# Patient Record
Sex: Female | Born: 1935 | Race: White | Hispanic: No | Marital: Single | Smoking: Never smoker
Health system: Southern US, Community
[De-identification: ages and names within clinical notes are randomized; demographics above are authoritative.]

## PROBLEM LIST (undated history)

## (undated) DIAGNOSIS — I10 Essential (primary) hypertension: Secondary | ICD-10-CM

## (undated) DIAGNOSIS — K219 Gastro-esophageal reflux disease without esophagitis: Secondary | ICD-10-CM

## (undated) DIAGNOSIS — D649 Anemia, unspecified: Secondary | ICD-10-CM

## (undated) HISTORY — DX: Gastro-esophageal reflux disease without esophagitis: K21.9

## (undated) HISTORY — PX: APPENDECTOMY: SHX54

## (undated) HISTORY — DX: Essential (primary) hypertension: I10

---

## 1998-03-13 ENCOUNTER — Emergency Department (HOSPITAL_COMMUNITY): Admission: EM | Admit: 1998-03-13 | Discharge: 1998-03-13 | Payer: Self-pay | Admitting: Emergency Medicine

## 2002-03-31 ENCOUNTER — Inpatient Hospital Stay (HOSPITAL_COMMUNITY): Admission: EM | Admit: 2002-03-31 | Discharge: 2002-04-01 | Payer: Self-pay | Admitting: Emergency Medicine

## 2002-03-31 ENCOUNTER — Encounter: Payer: Self-pay | Admitting: Emergency Medicine

## 2002-04-07 ENCOUNTER — Encounter: Admission: RE | Admit: 2002-04-07 | Discharge: 2002-04-07 | Payer: Self-pay | Admitting: Family Medicine

## 2011-12-27 ENCOUNTER — Inpatient Hospital Stay (HOSPITAL_COMMUNITY)
Admission: EM | Admit: 2011-12-27 | Discharge: 2011-12-30 | DRG: 812 | Disposition: A | Discharge status: Home / Self Care | Payer: Medicare Other | Attending: Internal Medicine | Admitting: Internal Medicine

## 2011-12-27 ENCOUNTER — Encounter (HOSPITAL_COMMUNITY): Payer: Self-pay | Admitting: *Deleted

## 2011-12-27 DIAGNOSIS — D509 Iron deficiency anemia, unspecified: Secondary | ICD-10-CM | POA: Diagnosis present

## 2011-12-27 DIAGNOSIS — D649 Anemia, unspecified: Secondary | ICD-10-CM

## 2011-12-27 DIAGNOSIS — K449 Diaphragmatic hernia without obstruction or gangrene: Secondary | ICD-10-CM | POA: Diagnosis present

## 2011-12-27 DIAGNOSIS — R0989 Other specified symptoms and signs involving the circulatory and respiratory systems: Secondary | ICD-10-CM | POA: Diagnosis present

## 2011-12-27 DIAGNOSIS — R0600 Dyspnea, unspecified: Secondary | ICD-10-CM | POA: Diagnosis present

## 2011-12-27 DIAGNOSIS — R0609 Other forms of dyspnea: Secondary | ICD-10-CM | POA: Diagnosis present

## 2011-12-27 DIAGNOSIS — D62 Acute posthemorrhagic anemia: Principal | ICD-10-CM | POA: Diagnosis present

## 2011-12-27 DIAGNOSIS — I10 Essential (primary) hypertension: Secondary | ICD-10-CM

## 2011-12-27 DIAGNOSIS — R0602 Shortness of breath: Secondary | ICD-10-CM

## 2011-12-27 DIAGNOSIS — K259 Gastric ulcer, unspecified as acute or chronic, without hemorrhage or perforation: Secondary | ICD-10-CM | POA: Diagnosis present

## 2011-12-27 HISTORY — DX: Anemia, unspecified: D64.9

## 2011-12-27 MED ORDER — SODIUM CHLORIDE 0.9 % IV SOLN
INTRAVENOUS | Status: DC
  Administered 2011-12-28 (×2): via INTRAVENOUS
  Administered 2011-12-29: 500 mL via INTRAVENOUS
  Administered 2011-12-29: 06:00:00 via INTRAVENOUS

## 2011-12-27 NOTE — ED Notes (Signed)
Pt was sent here by [facility] center after results from Hgb was 3.5.  Pt has been feeling sob with exertion.  Pt is very pale.  Pt denies any CP.  Pt denies any rectal bleeding or dark, black stools.  Pt denies any vaginal bleeding.  Pt went to be seen yesterday at [facility] center due to sob with exertion which became worse last week but had been present for about one month.

## 2011-12-28 DIAGNOSIS — R0600 Dyspnea, unspecified: Secondary | ICD-10-CM | POA: Diagnosis present

## 2011-12-28 DIAGNOSIS — D62 Acute posthemorrhagic anemia: Secondary | ICD-10-CM | POA: Diagnosis present

## 2011-12-28 DIAGNOSIS — D509 Iron deficiency anemia, unspecified: Secondary | ICD-10-CM | POA: Diagnosis present

## 2011-12-28 DIAGNOSIS — I059 Rheumatic mitral valve disease, unspecified: Secondary | ICD-10-CM

## 2011-12-28 HISTORY — DX: Dyspnea, unspecified: R06.00

## 2011-12-28 HISTORY — DX: Iron deficiency anemia, unspecified: D50.9

## 2011-12-28 HISTORY — DX: Acute posthemorrhagic anemia: D62

## 2011-12-28 LAB — COMPREHENSIVE METABOLIC PANEL
ALT: 9 U/L (ref 0–35)
AST: 12 U/L (ref 0–37)
Albumin: 3.2 g/dL — ABNORMAL LOW (ref 3.5–5.2)
Alkaline Phosphatase: 74 U/L (ref 39–117)
BUN: 16 mg/dL (ref 6–23)
CO2: 23 mEq/L (ref 19–32)
Calcium: 8.5 mg/dL (ref 8.4–10.5)
Chloride: 106 mEq/L (ref 96–112)
GFR calc Af Amer: >90 mL/min (ref >90)
GFR calc non Af Amer: 80 mL/min — ABNORMAL LOW (ref >90)
Glucose, Bld: 105 mg/dL — ABNORMAL HIGH (ref 70–99)
Potassium: 4.1 mEq/L (ref 3.5–5.1)
Sodium: 140 mEq/L (ref 135–145)
Total Bilirubin: 0.4 mg/dL (ref 0.3–1.2)
Total Protein: 6.2 g/dL (ref 6.0–8.3)

## 2011-12-28 LAB — POCT I-STAT, CHEM 8
BUN: 16 mg/dL (ref 6–23)
Chloride: 107 mEq/L (ref 96–112)
Creatinine, Ser: 0.9 mg/dL (ref 0.50–1.10)
Sodium: 141 mEq/L (ref 135–145)
TCO2: 21 mmol/L (ref 0–100)

## 2011-12-28 LAB — OCCULT BLOOD, POC DEVICE: Fecal Occult Bld: NEGATIVE

## 2011-12-28 LAB — TSH: TSH: 4.176 u[IU]/mL (ref 0.350–4.500)

## 2011-12-28 LAB — CBC WITH DIFFERENTIAL/PLATELET
Basophils Absolute: 0.1 10*3/uL (ref 0.0–0.1)
Basophils Relative: 1 % (ref 0–1)
Hemoglobin: 3.6 g/dL — CL (ref 12.0–15.0)
Lymphocytes Relative: 15 % (ref 12–46)
Lymphs Abs: 1.3 10*3/uL (ref 0.7–4.0)
MCH: 13.1 pg — ABNORMAL LOW (ref 26.0–34.0)
MCV: 53.8 fL — ABNORMAL LOW (ref 78.0–100.0)
Monocytes Absolute: 1 10*3/uL (ref 0.1–1.0)
Monocytes Relative: 12 % (ref 3–12)
Neutro Abs: 5.9 10*3/uL (ref 1.7–7.7)
Neutrophils Relative %: 71 % (ref 43–77)
Platelets: 342 10*3/uL (ref 150–400)
RBC: 2.75 MIL/uL — ABNORMAL LOW (ref 3.87–5.11)
RDW: 20.4 % — ABNORMAL HIGH (ref 11.5–15.5)
WBC: 8.4 10*3/uL (ref 4.0–10.5)

## 2011-12-28 LAB — MRSA PCR SCREENING: MRSA by PCR: NEGATIVE

## 2011-12-28 LAB — IRON AND TIBC: UIBC: 462 ug/dL — ABNORMAL HIGH (ref 125–400)

## 2011-12-28 LAB — RETICULOCYTES
RBC.: 2.75 MIL/uL — ABNORMAL LOW (ref 3.87–5.11)
RBC.: 3.33 MIL/uL — ABNORMAL LOW (ref 3.87–5.11)
Retic Count, Absolute: 23.3 10*3/uL (ref 19.0–186.0)
Retic Ct Pct: 3.1 % (ref 0.4–3.1)
Retic Ct Pct: 0.7 % (ref 0.4–3.1)

## 2011-12-28 LAB — CEA: CEA: <0.5 ng/mL (ref 0.0–5.0)

## 2011-12-28 LAB — CBC
HCT: 20.4 % — ABNORMAL LOW (ref 36.0–46.0)
Hemoglobin: 5.8 g/dL — CL (ref 12.0–15.0)
MCHC: 28.4 g/dL — ABNORMAL LOW (ref 30.0–36.0)
RBC: 3.33 MIL/uL — ABNORMAL LOW (ref 3.87–5.11)

## 2011-12-28 LAB — BASIC METABOLIC PANEL
BUN: 13 mg/dL (ref 6–23)
Chloride: 106 mEq/L (ref 96–112)
GFR calc Af Amer: >90 mL/min (ref >90)
GFR calc non Af Amer: 82 mL/min — ABNORMAL LOW (ref >90)
Glucose, Bld: 88 mg/dL (ref 70–99)
Potassium: 3.7 mEq/L (ref 3.5–5.1)
Sodium: 139 mEq/L (ref 135–145)

## 2011-12-28 LAB — FERRITIN: Ferritin: <1 ng/mL — ABNORMAL LOW (ref 10–291)

## 2011-12-28 LAB — PREPARE RBC (CROSSMATCH)

## 2011-12-28 MED ORDER — SODIUM CHLORIDE 0.9 % IV SOLN
25.0000 mg | Freq: Once | INTRAVENOUS | Status: AC
  Administered 2011-12-28: 25 mg via INTRAVENOUS
  Filled 2011-12-28: qty 0.5

## 2011-12-28 MED ORDER — HYDROCODONE-ACETAMINOPHEN 5-325 MG PO TABS: 1.0000 | ORAL_TABLET | ORAL | Status: DC | PRN

## 2011-12-28 MED ORDER — SODIUM CHLORIDE 0.9 % IV SOLN
25.0000 mg | Freq: Once | INTRAVENOUS | Status: DC
  Filled 2011-12-28: qty 0.5

## 2011-12-28 MED ORDER — SODIUM CHLORIDE 0.9 % IV SOLN
500.0000 mg | Freq: Once | INTRAVENOUS | Status: DC
  Filled 2011-12-28: qty 10

## 2011-12-28 MED ORDER — SODIUM CHLORIDE 0.9 % IV SOLN: INTRAVENOUS | Status: DC

## 2011-12-28 MED ORDER — ONDANSETRON HCL 4 MG/2ML IJ SOLN: 4.0000 mg | Freq: Four times a day (QID) | INTRAMUSCULAR | Status: DC | PRN

## 2011-12-28 MED ORDER — SODIUM CHLORIDE 0.9 % IV SOLN
1250.0000 mg | Freq: Once | INTRAVENOUS | Status: AC
  Administered 2011-12-28: 1250 mg via INTRAVENOUS
  Filled 2011-12-28: qty 25

## 2011-12-28 MED ORDER — ONDANSETRON HCL 4 MG PO TABS: 4.0000 mg | ORAL_TABLET | Freq: Four times a day (QID) | ORAL | Status: DC | PRN

## 2011-12-28 MED ORDER — SODIUM CHLORIDE 0.9 % IV SOLN
125.0000 mg | Freq: Once | INTRAVENOUS | Status: DC
  Filled 2011-12-28: qty 10

## 2011-12-28 NOTE — Progress Notes (Signed)
Pt was given 1 unit of PRBCs. Pt tolerated infusion well. Infusion is complete. Pt's VS are bp 152/73, r-18, o2-100%, t-98.3

## 2011-12-28 NOTE — Consult Note (Signed)
[facility] Reason for consult: [facility]. PCP: None. Primary GI: None patient and the son Referring Physician:   [name] is an 78 y.o. female.  HPI: Remarkably healthy for 78 years old. No past medical history and takes no medications. She reports that she does not like to see doctors. Has been somewhat fatigued in short of breath the past 2-3 weeks much worse over the past week. She went to a physician and [name] [name] and her blood work returned with a hemoglobin of 3. Lab work has revealed normal renal and liver function and nondetectable iron and ferritin. B12 and folic acid were normal. MCV was markedly low platelet count was normal. The patient has never had any GI workup. She has never had EGD or colonoscopy or sigmoidoscopy. Her mother may have had an ulcer when she was young no family history of colon cancer polyps or IBD. She denies heartburn, dyspepsia, difficulty swallowing, melena or hematochezia. Her bowel movements have been unchanged and occur daily on a regular basis. Her appetite is been good and she has to diet to avoid weight gain and is noticed no difference at all in her appetite or bowel movements.  Past Medical History  Diagnosis Date  . Anemia     hgb 3.5 on blood drawn 12/4    Past Surgical History  Procedure Date  . Appendectomy     History reviewed. No pertinent family history.  Social History:  reports that she has never smoked. She does not have any smokeless tobacco history on file. She reports that she does not drink alcohol or use illicit drugs.  Allergies: No Known Allergies  Medications;   PRN Meds HYDROcodone-acetaminophen, ondansetron (ZOFRAN) IV, ondansetron Results for orders placed during the hospital encounter of 12/27/11 (from the past 48 hour(s))  CBC WITH DIFFERENTIAL     Status: Abnormal   Collection Time   12/27/11 11:29 PM      Component Value Range Comment   WBC 8.4  4.0 - 10.5 K/uL    RBC 2.75 (*) 3.87 -  5.11 MIL/uL    Hemoglobin 3.6 (*) 12.0 - 15.0 g/dL    HCT 16.1 (*) 09.6 - 46.0 %    MCV 53.8 (*) 78.0 - 100.0 fL    MCH 13.1 (*) 26.0 - 34.0 pg    MCHC 24.3 (*) 30.0 - 36.0 g/dL    RDW 04.5 (*) 40.9 - 15.5 %    Platelets 342  150 - 400 K/uL    Neutrophils Relative 71  43 - 77 %    Lymphocytes Relative 15  12 - 46 %    Monocytes Relative 12  3 - 12 %    Eosinophils Relative 1  0 - 5 %    Basophils Relative 1  0 - 1 %    Neutro Abs 5.9  1.7 - 7.7 K/uL    Lymphs Abs 1.3  0.7 - 4.0 K/uL    Monocytes Absolute 1.0  0.1 - 1.0 K/uL    Eosinophils Absolute 0.1  0.0 - 0.7 K/uL    Basophils Absolute 0.1  0.0 - 0.1 K/uL    RBC Morphology POLYCHROMASIA PRESENT      Smear Review LARGE PLATELETS PRESENT   PLATELETS APPEAR ADEQUATE  COMPREHENSIVE METABOLIC PANEL     Status: Abnormal   Collection Time   12/27/11 11:29 PM      Component Value Range Comment   Sodium 140  135 - 145 mEq/L    Potassium 4.1  3.5 - 5.1 mEq/L    Chloride 106  96 - 112 mEq/L    CO2 23  19 - 32 mEq/L    Glucose, Bld 105 (*) 70 - 99 mg/dL    BUN 16  6 - 23 mg/dL    Creatinine, Ser 7.82  0.50 - 1.10 mg/dL    Calcium 8.5  8.4 - 95.6 mg/dL    Total Protein 6.2  6.0 - 8.3 g/dL    Albumin 3.2 (*) 3.5 - 5.2 g/dL    AST 12  0 - 37 U/L    ALT 9  0 - 35 U/L    Alkaline Phosphatase 74  39 - 117 U/L    Total Bilirubin 0.4  0.3 - 1.2 mg/dL    GFR calc non Af Amer 80 (*) >90 mL/min    GFR calc Af Amer >90  >90 mL/min   VITAMIN B12     Status: Normal   Collection Time   12/27/11 11:29 PM      Component Value Range Comment   Vitamin B-12 353  211 - 911 pg/mL   FOLATE     Status: Normal   Collection Time   12/27/11 11:29 PM      Component Value Range Comment   Folate 13.6     IRON AND TIBC     Status: Abnormal   Collection Time   12/27/11 11:29 PM      Component Value Range Comment   Iron <10 (*) 42 - 135 ug/dL    TIBC Not calculated due to Iron <10.  250 - 470 ug/dL    Saturation Ratios Not calculated due to Iron <10.  20 -  55 %    UIBC 462 (*) 125 - 400 ug/dL   FERRITIN     Status: Abnormal   Collection Time   12/27/11 11:29 PM      Component Value Range Comment   Ferritin <1 (*) 10 - 291 ng/mL   RETICULOCYTES     Status: Abnormal   Collection Time   12/27/11 11:29 PM      Component Value Range Comment   Retic Ct Pct 3.1  0.4 - 3.1 %    RBC. 2.75 (*) 3.87 - 5.11 MIL/uL    Retic Count, Manual 85.3  19.0 - 186.0 K/uL   TSH     Status: Normal   Collection Time   12/27/11 11:29 PM      Component Value Range Comment   TSH 4.176  0.350 - 4.500 uIU/mL   TYPE AND SCREEN     Status: Normal (Preliminary result)   Collection Time   12/27/11 11:36 PM      Component Value Range Comment   ABO/RH(D) A POS      Antibody Screen NEG      Sample Expiration 12/30/2011      Unit Number [id number]      Blood Component Type RED CELLS,LR      Unit division 00      Status of Unit ISSUED      Transfusion Status OK TO TRANSFUSE      Crossmatch Result Compatible      Unit Number [id number]      Blood Component Type RED CELLS,LR      Unit division 00      Status of Unit ISSUED      Transfusion Status OK TO TRANSFUSE      Crossmatch Result Compatible     ABO/RH  Status: Normal   Collection Time   12/27/11 11:36 PM      Component Value Range Comment   ABO/RH(D) A POS     PREPARE RBC (CROSSMATCH)     Status: Normal   Collection Time   12/28/11 12:28 AM      Component Value Range Comment   Order Confirmation ORDER PROCESSED BY BLOOD BANK     OCCULT BLOOD, POC DEVICE     Status: Normal   Collection Time   12/28/11 12:32 AM      Component Value Range Comment   Fecal Occult Bld NEGATIVE     PROTIME-INR     Status: Normal   Collection Time   12/28/11 12:36 AM      Component Value Range Comment   Prothrombin Time 13.9  11.6 - 15.2 seconds    INR 1.08  0.00 - 1.49   POCT I-STAT, CHEM 8     Status: Abnormal   Collection Time   12/28/11 12:46 AM      Component Value Range Comment   Sodium 141  135 - 145 mEq/L     Potassium 4.1  3.5 - 5.1 mEq/L    Chloride 107  96 - 112 mEq/L    BUN 16  6 - 23 mg/dL    Creatinine, Ser 0.45  0.50 - 1.10 mg/dL    Glucose, Bld 98  70 - 99 mg/dL    Calcium, Ion 4.09  8.11 - 1.30 mmol/L    TCO2 21  0 - 100 mmol/L    Hemoglobin 4.8 (*) 12.0 - 15.0 g/dL    HCT 91.4 (*) 78.2 - 46.0 %    Comment NOTIFIED PHYSICIAN     MRSA PCR SCREENING     Status: Normal   Collection Time   12/28/11  3:10 AM      Component Value Range Comment   MRSA by PCR NEGATIVE  NEGATIVE   BASIC METABOLIC PANEL     Status: Abnormal   Collection Time   12/28/11  8:26 AM      Component Value Range Comment   Sodium 139  135 - 145 mEq/L    Potassium 3.7  3.5 - 5.1 mEq/L    Chloride 106  96 - 112 mEq/L    CO2 22  19 - 32 mEq/L    Glucose, Bld 88  70 - 99 mg/dL    BUN 13  6 - 23 mg/dL    Creatinine, Ser 9.56  0.50 - 1.10 mg/dL    Calcium 8.2 (*) 8.4 - 10.5 mg/dL    GFR calc non Af Amer 82 (*) >90 mL/min    GFR calc Af Amer >90  >90 mL/min   CBC     Status: Abnormal   Collection Time   12/28/11  8:26 AM      Component Value Range Comment   WBC 7.5  4.0 - 10.5 K/uL    RBC 3.33 (*) 3.87 - 5.11 MIL/uL    Hemoglobin 5.8 (*) 12.0 - 15.0 g/dL    HCT 21.3 (*) 08.6 - 46.0 %    MCV 61.3 (*) 78.0 - 100.0 fL POST TRANSFUSION SPECIMEN   MCH 17.4 (*) 26.0 - 34.0 pg    MCHC 28.4 (*) 30.0 - 36.0 g/dL    RDW 57.8 (*) 46.9 - 15.5 %    Platelets 294  150 - 400 K/uL   RETICULOCYTES     Status: Abnormal   Collection Time   12/28/11  8:26 AM      Component Value Range Comment   Retic Ct Pct 0.7  0.4 - 3.1 %    RBC. 3.33 (*) 3.87 - 5.11 MIL/uL    Retic Count, Manual 23.3  19.0 - 186.0 K/uL   TROPONIN I     Status: Normal   Collection Time   12/28/11  8:26 AM      Component Value Range Comment   Troponin I <0.30  <0.30 ng/mL     No results found. ROS: Constitutional: As above only recent fatigue in appetite. HEENT: Negative Cardiovascular: Negative Respiratory: Negative no history of prior smoking GI:  Please see above GU: Negative Musculoskeletal: Occasional arthritic complaints Neuro/Psychiatric: Endocrine/Heme:            Blood pressure 153/45, pulse 78, temperature 98.1 F (36.7 C), temperature source Oral, resp. rate 18, height 5\' 5"  (1.651 m), weight 82.2 kg (181 lb 3.5 oz), SpO2 96.00%.  Physical exam:   Gen.-very pale white female in no acute distress. Eyes-sclerae are nonicteric Heart-regular and without murmurs or gallops Abdomen-soft and completely nontender. Rectal was not repeated stool was heme negative in the emergency room.  Assessment: Profound iron deficiency anemia. The patient has no gross symptoms of GI bleeding and her stools are negative. This is probably something that has to been developing over time and we definitely need to rule out upper GI and lower GI tumors. Celiac disease is also possible.  Plan: We'll go ahead and give her a full liquid diet. Agree with transfusing to a hemoglobin of around 8. She is only up to 5.8 currently after several units. We'll start off tomorrow with EGD to rule out upper GI lesion and obtain small bowel biopsies to evaluate for celiac disease. She should probably have colonoscopy this admission as well. I would consider going ahead and given her several doses of IV iron all she is here in the hospital. Have discussed this with the patient.   [name] [name],Karna Abed L 12/28/2011, 11:50 AM

## 2011-12-28 NOTE — Progress Notes (Addendum)
Iron replacement consult   Pt with severe iron deficiency anemia. Serum iron is <10. Her hemoglobin is 5.8. She is at a total deficit of around 1250mg  of iron.   Plan  Iron test dose 25mg  x1 Iron dextran 1250mg  IV x1 Rx will sign off

## 2011-12-28 NOTE — Progress Notes (Signed)
PT Cancellation Note  Patient Details Name: [name] [name] MRN: [id number] DOB: 1933-05-13   Cancelled Treatment:    Reason Eval/Treat Not Completed: Medical issues which prohibited therapy (Pt Hgb 4.8) Will await medical stability prior to eval. Will check back next date. Delaney Meigs, PT [phone]

## 2011-12-28 NOTE — Progress Notes (Signed)
  Echocardiogram 2D Echocardiogram has been performed.  Georgian Co 12/28/2011, 11:53 AM

## 2011-12-28 NOTE — Progress Notes (Signed)
Patient is alert and oriented, gait is stable.  Able to ambulate from bed to bathroom with minimal assist.  Denies of pain or discomfort.    Patient has only 1 IV access and she refused to have another access to be placed.  First unit of PRBC completed without any adverse reactions.  Unable to give the iron dextran d/t blood transfusion and she only have 1 IV access.  Receiving nurse, Zella Ball, was made aware of this.  Transferred to 5500 via wheelchair.  Message left to Ms. [name] [name]'s phone regarding patient's transfer.

## 2011-12-28 NOTE — ED Provider Notes (Signed)
History     CSN: [id number]  Arrival date & time 12/27/11  2247   First MD Initiated Contact with Patient 12/27/11 2317      Chief Complaint  Patient presents with  . Abnormal Lab    HgB 3.5 at her MD    (Consider location/radiation/quality/duration/timing/severity/associated sxs/prior treatment) HPI History provided by patient. Has been feeling short of breath for the last week or so and getting progressively worse. She was evaluated at [facility] with blood work on 12/26/2011. She was called the following day and told to come to the emergency department because she was severely anemic. Patient does not have a primary care physician and is very anxious about seeing physicians. She waited for her daughter to return from vacation today before coming here to the emergency department. She gets more short of breath with any exertion. She has no history of anemia. She denies any black or tarry stools. She denies any blood loss. No history of kidney disease. No nausea vomiting or diarrhea. No abdominal pain. No chest pain. Symptoms moderate severity. No history of same. Past Medical History  Diagnosis Date  . Anemia     hgb 3.5 on blood drawn 12/4    Past Surgical History  Procedure Date  . Appendectomy     History reviewed. No pertinent family history.  History  Substance Use Topics  . Smoking status: Never Smoker   . Smokeless tobacco: Not on file  . Alcohol Use: No    OB History    Grav Para Term Preterm Abortions TAB SAB Ect Mult Living                  Review of Systems  Constitutional: Negative for fever and chills.  HENT: Negative for neck pain and neck stiffness.   Eyes: Negative for pain.  Respiratory: Positive for shortness of breath.   Cardiovascular: Negative for chest pain.  Gastrointestinal: Negative for abdominal pain.  Genitourinary: Negative for dysuria.  Musculoskeletal: Negative for back pain.  Skin: Negative for rash.  Neurological: Positive  for light-headedness. Negative for headaches.  All other systems reviewed and are negative.    Allergies  Review of patient's allergies indicates no known allergies.  Home Medications   Current Outpatient Rx  Name  Route  Sig  Dispense  Refill  . POTASSIUM 95 MG PO TABS   Oral   Take 1 tablet by mouth daily.           BP 115/42  Pulse 85  Temp 98.6 F (37 C) (Oral)  Resp 22  Ht 5' 5.5" (1.664 m)  Wt 182 lb (82.555 kg)  BMI 29.83 kg/m2  SpO2 96%  Physical Exam  Constitutional: She is oriented to person, place, and time. She appears well-developed and well-nourished.  HENT:  Head: Normocephalic and atraumatic.  Eyes: EOM are normal. Pupils are equal, round, and reactive to light.       Conjunctivae pale  Neck: Trachea normal. Neck supple. No thyromegaly present.  Cardiovascular: Normal rate, regular rhythm, S1 normal, S2 normal and normal pulses.   Pulses:      Radial pulses are 2+ on the right side, and 2+ on the left side.  Pulmonary/Chest: Effort normal and breath sounds normal. She has no wheezes. She has no rhonchi. She has no rales. She exhibits no tenderness.  Abdominal: Soft. Normal appearance and bowel sounds are normal. There is no tenderness. There is no CVA tenderness and negative Murphy's sign.  Genitourinary:  Rectal: hard stool rectal vault, no evidence of bleeding  Musculoskeletal: Normal range of motion. She exhibits no edema and no tenderness.  Neurological: She is alert and oriented to person, place, and time. She has normal strength. No cranial nerve deficit or sensory deficit. GCS eye subscore is 4. GCS verbal subscore is 5. GCS motor subscore is 6.  Skin: Skin is warm and dry. She is not diaphoretic. There is pallor.  Psychiatric: Her speech is normal.       Cooperative and appropriate    ED Course  Procedures (including critical care time)  Labs sent including T/S.   Results for orders placed during the hospital encounter of 12/27/11   CBC WITH DIFFERENTIAL      Component Value Range   WBC 8.4  4.0 - 10.5 K/uL   RBC 2.75 (*) 3.87 - 5.11 MIL/uL   Hemoglobin 3.6 (*) 12.0 - 15.0 g/dL   HCT 78.2 (*) 95.6 - 21.3 %   MCV 53.8 (*) 78.0 - 100.0 fL   MCH 13.1 (*) 26.0 - 34.0 pg   MCHC 24.3 (*) 30.0 - 36.0 g/dL   RDW 08.6 (*) 57.8 - 46.9 %   Platelets 342  150 - 400 K/uL   Neutrophils Relative 71  43 - 77 %   Lymphocytes Relative 15  12 - 46 %   Monocytes Relative 12  3 - 12 %   Eosinophils Relative 1  0 - 5 %   Basophils Relative 1  0 - 1 %   Neutro Abs 5.9  1.7 - 7.7 K/uL   Lymphs Abs 1.3  0.7 - 4.0 K/uL   Monocytes Absolute 1.0  0.1 - 1.0 K/uL   Eosinophils Absolute 0.1  0.0 - 0.7 K/uL   Basophils Absolute 0.1  0.0 - 0.1 K/uL   RBC Morphology POLYCHROMASIA PRESENT     Smear Review LARGE PLATELETS PRESENT    COMPREHENSIVE METABOLIC PANEL      Component Value Range   Sodium 140  135 - 145 mEq/L   Potassium 4.1  3.5 - 5.1 mEq/L   Chloride 106  96 - 112 mEq/L   CO2 23  19 - 32 mEq/L   Glucose, Bld 105 (*) 70 - 99 mg/dL   BUN 16  6 - 23 mg/dL   Creatinine, Ser 6.29  0.50 - 1.10 mg/dL   Calcium 8.5  8.4 - 52.8 mg/dL   Total Protein 6.2  6.0 - 8.3 g/dL   Albumin 3.2 (*) 3.5 - 5.2 g/dL   AST 12  0 - 37 U/L   ALT 9  0 - 35 U/L   Alkaline Phosphatase 74  39 - 117 U/L   Total Bilirubin 0.4  0.3 - 1.2 mg/dL   GFR calc non Af Amer 80 (*) >90 mL/min   GFR calc Af Amer >90  >90 mL/min  TYPE AND SCREEN      Component Value Range   ABO/RH(D) A POS     Antibody Screen NEG     Sample Expiration 12/30/2011     Unit Number [id number]     Blood Component Type RED CELLS,LR     Unit division 00     Status of Unit ISSUED     Transfusion Status OK TO TRANSFUSE     Crossmatch Result Compatible     Unit Number [id number]     Blood Component Type RED CELLS,LR     Unit division 00     Status of Unit  ALLOCATED     Transfusion Status OK TO TRANSFUSE     Crossmatch Result Compatible    RETICULOCYTES      Component Value  Range   Retic Ct Pct 3.1  0.4 - 3.1 %   RBC. 2.75 (*) 3.87 - 5.11 MIL/uL   Retic Count, Manual 85.3  19.0 - 186.0 K/uL  ABO/RH      Component Value Range   ABO/RH(D) A POS    PREPARE RBC (CROSSMATCH)      Component Value Range   Order Confirmation ORDER PROCESSED BY BLOOD BANK    OCCULT BLOOD, POC DEVICE      Component Value Range   Fecal Occult Bld NEGATIVE     12:49 AM case discussed with [facility] hospitalist on-call, she agrees to admission to step down ICU. Blood transfusion ordered and is pending.  1:03 AM d/w GI on call, will see in am MDM   Symptomatic anemia found to have a hemoglobin of 3.6, no clinical evidence of bleeding source. Hard stool and rectal exam. Labs reviewed as above creatinine within normal limits. Blood transfusion initiated in the emergency department. Medical admission stepdown ICU.        Sunnie Nielsen, MD 12/28/11 [phone]

## 2011-12-28 NOTE — H&P (Signed)
[facility] History and Physical  [name] [name] ZOX:[id number] DOB: 1933-11-18 DOA: 12/27/2011  Referring physician: ED physician PCP: No primary provider on file.   Chief Complaint: Shortness of breath  HPI:  Pt is 78 yo female with no PCP and no significant past medical history who presents to [facility] ED with main concern of progressively worsening shortness of breath that initially started one week prior to admission, worse with exertion and associated with generalized weakness. She lives in [place] and has seen one doctor for shortness of breath and blood work was done. She was called next day and told she has severe anemia and needs to come to the hospital for further evaluation. She report being in her usual state of health, never had similar events in the past. She denies chest pain, fevers, chills, other systemic symptoms, no abdominal or urinary concerns.   ED EVENTS: Hf found to ~3, pt transfused 2 units PRBC, GI consult obtained, FOBT + in ED on exam  Assessment and Plan:  Principal Problem:  *Shortness of breath - likely secondary to severe acute blood loss anemia, no worrisome infectious etiology at this time - will admit the pt to stepdown unit for now - follow up on GI recommendations - continue transfusion and obtain post transfusion H/H - plan on transfusing 2 additional units of PRBC - monitor oxygen per floor protocol - provide oxygen via [place] as needed Active Problems:  Acute blood loss anemia - unclear etiology - GI consult obtain - will follow up on recommendation - continue transfusion   Code Status: Full Family Communication: Pt at bedside Disposition Plan: Admit to stepdown unit   Review of Systems:  Constitutional: Negative for fever, chills, positive for malaise/fatigue. Negative for diaphoresis.  HENT: Negative for hearing loss, ear pain, nosebleeds, congestion, sore throat, neck pain, tinnitus and ear discharge.   Eyes: Negative for blurred  vision, double vision, photophobia, pain, discharge and redness.  Respiratory: Negative for cough, hemoptysis, sputum production, positive for shortness of breath.   Cardiovascular: Negative for chest pain, palpitations, orthopnea, claudication and leg swelling.  Gastrointestinal: Negative for nausea, vomiting and abdominal pain. Negative for heartburn, constipation, blood in stool and melena.  Genitourinary: Negative for dysuria, urgency, frequency, hematuria and flank pain.  Musculoskeletal: Negative for myalgias, back pain, joint pain and falls.  Skin: Negative for itching and rash.  Neurological: Negative for dizziness and weakness. Negative for tingling, tremors, sensory change, speech change, focal weakness, loss of consciousness and headaches.  Endo/Heme/Allergies: Negative for environmental allergies and polydipsia. Does not bruise/bleed easily.  Psychiatric/Behavioral: Negative for suicidal ideas. The patient is not nervous/anxious.      Past Medical History  Diagnosis Date  . Anemia     hgb 3.5 on blood drawn 12/4    Past Surgical History  Procedure Date  . Appendectomy     Social History:  reports that she has never smoked. She does not have any smokeless tobacco history on file. She reports that she does not drink alcohol or use illicit drugs.  No Known Allergies  No family history of cancers or heart disease.   Prior to Admission medications   Medication Sig Start Date End Date Taking? Authorizing Provider  Potassium 95 MG TABS Take 1 tablet by mouth daily.   Yes Historical Provider, MD    Physical Exam: Filed Vitals:   12/27/11 2257  BP: 115/42  Pulse: 85  Temp: 98.6 F (37 C)  TempSrc: Oral  Resp: 22  Height: 5' 5.5" (  1.664 m)  Weight: 82.555 kg (182 lb)  SpO2: 96%    Physical Exam  Constitutional: Appears well-developed and well-nourished. No distress.  HENT: Normocephalic. External right and left ear normal. Oropharynx is clear and moist.  Eyes:  Conjunctivae and EOM are normal. PERRLA, scleral icterus +  Neck: Normal ROM. Neck supple. No JVD. No tracheal deviation. No thyromegaly.  CVS: RRR, S1/S2 +, no murmurs, no gallops, no carotid bruit.  Pulmonary: Effort and breath sounds normal, no stridor, rhonchi, wheezes, rales.  Abdominal: Soft. BS +,  no distension, tenderness, rebound or guarding.  Musculoskeletal: Normal range of motion. No edema and no tenderness.  Lymphadenopathy: No lymphadenopathy noted, cervical, inguinal. Neuro: Alert. Normal reflexes, muscle tone coordination. No cranial nerve deficit. Skin: Skin is warm and dry. No rash noted. Not diaphoretic. No erythema. Pallor Psychiatric: Normal mood and affect. Behavior, judgment, thought content normal.   Labs on Admission:  Basic Metabolic Panel:  Lab 12/27/11 9604  NA 140  K 4.1  CL 106  CO2 23  GLUCOSE 105*  BUN 16  CREATININE 0.76  CALCIUM 8.5  MG --  PHOS --   Liver Function Tests:  Lab 12/27/11 2329  AST 12  ALT 9  ALKPHOS 74  BILITOT 0.4  PROT 6.2  ALBUMIN 3.2*   CBC:  Lab 12/27/11 2329  WBC 8.4  NEUTROABS 5.9  HGB 3.6*  HCT 14.8*  MCV 53.8*  PLT 342    Radiological Exams on Admission: No results found.  EKG: Normal sinus rhythm, no ST/T wave changes  Debbora Presto, MD  [facility] Pager [phone]  If 7PM-7AM, please contact night-coverage [email or web address] Password [email or web address] 12/28/2011, 12:51 AM

## 2011-12-28 NOTE — Progress Notes (Signed)
[facility] Progress Note [place] TEAM 1 - Stepdown/ICU TEAM   [name] [name] ZOX:[id number] DOB: 11/22/1933 DOA: 12/27/2011 PCP: No primary provider on file.  Brief narrative: 78 -year-old female patient who essentially has not been to the doctor since the 1970s. She has no apparent chronic medical problems. She presented to the emergency department because of progressive shortness of breath onset one week prior. The shortness of breath is mainly exertional in nature and is associated with generalized weakness. At some point in the past week she did seek medical attention and blood work was done. The following day she was telephoned to be made aware she has severe anemia and needed to present to the hospital for further evaluation and treatment. In the emergency department hemoglobin was checked and was quite low at 3.5. Patient denies symptoms such as hematemesis melena or bright red blood. She was subsequently admitted to the step down unit.  Assessment/Plan: Principal Problem:  *Acute blood loss anemia-symptomatic *Appreciate gastroenterology assistance *Plan is to pursue EGD on 12/29/2011 and a colonoscopy later during the hospitalization to rule out underlying malignancy. Other diagnosis in the differential include celiac disease since the anemia is likely chronic in nature. *GI has approved a full liquid diet *Initial fecal occult blood was negative but as precaution we will check a CEA level *Agree with recommendations to transfuse up to a hemoglobin of 8-patient has are to receive 2 units and will give an additional 2 units of packed red blood cells today 12/28/2011. *Not actively bleeding so only indication to repeat CBC would be following transfusion and daily at this point  Active Problems:  Dyspnea due to anemia/systolic murmur *Given patient's age and underlying anemia need to rule out related to cardiac ischemia therefore we'll cycle cardiac enzymes, check an EKG and a 2-D  echocardiogram   Iron (Fe) deficiency anemia *GI recommends getting IV iron infusion since iron level less than 10 *Test dose and one dose of IV iron has been ordered but we'll not give until after all blood products given today *TSH is normal  Routine health screening *Patient endorses she would like to establish with Dr. Martha Clan as her primary care physician after discharge *Patient has not had mammography since the 1970s and also has not undergone other routine screening such as cervical cancer screening and lipid panel and other routine laboratory data  DVT prophylaxis: SCDs Code Status: Full Family Communication: Spoke with patient Disposition Plan: Transfer to floor   Consultants: Gastroenterology  Procedures: None  Antibiotics: None  HPI/Subjective: Patient alert and denies shortness of breath at rest but states she has not been in to ambulate since receiving her first 2 units of packed red blood cells. Denied any pre-admission issues related to headache, near syncope, or chest pain. Denied weight loss as well.   Objective: Blood pressure 141/42, pulse 74, temperature 98.2 F (36.8 C), temperature source Oral, resp. rate 22, height 5\' 5"  (1.651 m), weight 82.2 kg (181 lb 3.5 oz), SpO2 100.00%.  Intake/Output Summary (Last 24 hours) at 12/28/11 1325 Last data filed at 12/28/11 0800  Gross per 24 hour  Intake   1325 ml  Output      0 ml  Net   1325 ml     Exam: General: No acute respiratory distress, pale in appearance Lungs: Clear to auscultation bilaterally without wheezes or crackles Cardiovascular: Regular rate and rhythm without murmur gallop or rub normal S1 and S2 Abdomen: Nontender, nondistended, soft, bowel sounds positive, no rebound,  no ascites, no appreciable mass Extremities: No significant cyanosis, clubbing, or edema bilateral lower extremities  Data Reviewed: Basic Metabolic Panel:  Lab 12/28/11 4540 12/28/11 0046 12/27/11 2329  NA 139  141 140  K 3.7 4.1 4.1  CL 106 107 106  CO2 22 -- 23  GLUCOSE 88 98 105*  BUN 13 16 16   CREATININE 0.71 0.90 0.76  CALCIUM 8.2* -- 8.5  MG -- -- --  PHOS -- -- --   Liver Function Tests:  Lab 12/27/11 2329  AST 12  ALT 9  ALKPHOS 74  BILITOT 0.4  PROT 6.2  ALBUMIN 3.2*   No results found for this basename: LIPASE:5,AMYLASE:5 in the last 168 hours No results found for this basename: AMMONIA:5 in the last 168 hours CBC:  Lab 12/28/11 0826 12/28/11 0046 12/27/11 2329  WBC 7.5 -- 8.4  NEUTROABS -- -- 5.9  HGB 5.8* 4.8* 3.6*  HCT 20.4* 14.0* 14.8*  MCV 61.3* -- 53.8*  PLT 294 -- 342   Cardiac Enzymes:  Lab 12/28/11 0826  CKTOTAL --  CKMB --  CKMBINDEX --  TROPONINI <0.30   BNP (last 3 results) No results found for this basename: PROBNP:3 in the last 8760 hours CBG: No results found for this basename: GLUCAP:5 in the last 168 hours  Recent Results (from the past 240 hour(s))  MRSA PCR SCREENING     Status: Normal   Collection Time   12/28/11  3:10 AM      Component Value Range Status Comment   MRSA by PCR NEGATIVE  NEGATIVE Final      Studies:  Recent x-ray studies have been reviewed in detail by the Attending Physician  Scheduled Meds:  Reviewed in detail by the Attending Physician   Junious Silk, ANP [facility] Office  [phone] Pager [phone]  On-Call/Text Page:      [email or web address]      password [email or web address]  If 7PM-7AM, please contact night-coverage [email or web address] Password [email or web address] 12/28/2011, 1:25 PM   LOS: 1 day   I have examined the patient, reviewed the chart and modified the above note which I agree with.   Calvert Cantor, MD [phone]

## 2011-12-29 ENCOUNTER — Encounter (HOSPITAL_COMMUNITY): Payer: Self-pay | Admitting: Gastroenterology

## 2011-12-29 ENCOUNTER — Encounter (HOSPITAL_COMMUNITY)
Admission: EM | Disposition: A | Discharge status: Home / Self Care | Payer: Self-pay | Source: Home / Self Care | Attending: Internal Medicine

## 2011-12-29 DIAGNOSIS — D509 Iron deficiency anemia, unspecified: Secondary | ICD-10-CM

## 2011-12-29 DIAGNOSIS — R0989 Other specified symptoms and signs involving the circulatory and respiratory systems: Secondary | ICD-10-CM

## 2011-12-29 HISTORY — PX: ESOPHAGOGASTRODUODENOSCOPY: SHX5428

## 2011-12-29 LAB — TYPE AND SCREEN
ABO/RH(D): A POS
Antibody Screen: NEGATIVE
Unit division: 0
Unit division: 0
Unit division: 0
Unit division: 0

## 2011-12-29 LAB — BASIC METABOLIC PANEL
CO2: 22 mEq/L (ref 19–32)
Calcium: 8.4 mg/dL (ref 8.4–10.5)
Creatinine, Ser: 0.66 mg/dL (ref 0.50–1.10)
GFR calc Af Amer: >90 mL/min (ref >90)
GFR calc non Af Amer: 84 mL/min — ABNORMAL LOW (ref >90)
Sodium: 141 mEq/L (ref 135–145)

## 2011-12-29 LAB — CBC
HCT: 26.2 % — ABNORMAL LOW (ref 36.0–46.0)
Hemoglobin: 7.8 g/dL — ABNORMAL LOW (ref 12.0–15.0)
MCHC: 29.8 g/dL — ABNORMAL LOW (ref 30.0–36.0)
MCV: 65.5 fL — ABNORMAL LOW (ref 78.0–100.0)
Platelets: 304 10*3/uL (ref 150–400)
RBC: 4.03 MIL/uL (ref 3.87–5.11)
WBC: 8.7 10*3/uL (ref 4.0–10.5)
WBC: 8.8 10*3/uL (ref 4.0–10.5)

## 2011-12-29 SURGERY — EGD (ESOPHAGOGASTRODUODENOSCOPY)
Anesthesia: Moderate Sedation

## 2011-12-29 MED ORDER — PANTOPRAZOLE SODIUM 40 MG PO TBEC
40.0000 mg | DELAYED_RELEASE_TABLET | Freq: Two times a day (BID) | ORAL | Status: DC
  Administered 2011-12-29 – 2011-12-30 (×2): 40 mg via ORAL
  Filled 2011-12-29 (×3): qty 1

## 2011-12-29 MED ORDER — PANTOPRAZOLE SODIUM 40 MG PO TBEC
40.0000 mg | DELAYED_RELEASE_TABLET | Freq: Every day | ORAL | Status: DC
  Administered 2011-12-29: 40 mg via ORAL
  Filled 2011-12-29: qty 1

## 2011-12-29 MED ORDER — MIDAZOLAM HCL 10 MG/2ML IJ SOLN
INTRAMUSCULAR | Status: DC | PRN
  Administered 2011-12-29 (×2): 2 mg via INTRAVENOUS
  Administered 2011-12-29: 1 mg via INTRAVENOUS

## 2011-12-29 MED ORDER — MIDAZOLAM HCL 5 MG/ML IJ SOLN
INTRAMUSCULAR | Status: AC
  Filled 2011-12-29: qty 2

## 2011-12-29 MED ORDER — FERROUS FUMARATE 325 (106 FE) MG PO TABS
1.0000 | ORAL_TABLET | Freq: Two times a day (BID) | ORAL | Status: DC
  Administered 2011-12-29 – 2011-12-30 (×3): 106 mg via ORAL
  Filled 2011-12-29 (×6): qty 1

## 2011-12-29 MED ORDER — LORAZEPAM 0.5 MG PO TABS
0.5000 mg | ORAL_TABLET | Freq: Once | ORAL | Status: AC
  Administered 2011-12-29: 0.5 mg via ORAL
  Filled 2011-12-29: qty 1

## 2011-12-29 MED ORDER — FENTANYL CITRATE 0.05 MG/ML IJ SOLN
INTRAMUSCULAR | Status: AC
  Filled 2011-12-29: qty 2

## 2011-12-29 MED ORDER — MIDAZOLAM HCL 5 MG/ML IJ SOLN
INTRAMUSCULAR | Status: AC
  Filled 2011-12-29: qty 1

## 2011-12-29 MED ORDER — FENTANYL CITRATE 0.05 MG/ML IJ SOLN
INTRAMUSCULAR | Status: DC | PRN
  Administered 2011-12-29 (×2): 25 ug via INTRAVENOUS

## 2011-12-29 MED ORDER — BUTAMBEN-TETRACAINE-BENZOCAINE 2-2-14 % EX AERO
INHALATION_SPRAY | CUTANEOUS | Status: DC | PRN
  Administered 2011-12-29: 2 via TOPICAL

## 2011-12-29 NOTE — Progress Notes (Signed)
Pt hgb-7.8, HCt-26.2 & RBC-3.99 post transfusion.  Paged MD on call who stated to just continue to monitor pt at this point.

## 2011-12-29 NOTE — Op Note (Signed)
[facility] [address] [place], [address]   ENDOSCOPY PROCEDURE REPORT  PATIENT: [name], [name]  MR#: [id number] BIRTHDATE: 11/01/1933 , 78  yrs. old GENDER: Female  ENDOSCOPIST: Vida Rigger, MD REFERRED BY:  PROCEDURE DATE:  12/29/2011 PROCEDURE:   EGD w/ biopsy ASA CLASS:   Class II INDICATIONS:Iron deficiency anemia.  MEDICATIONS: Fentanyl 50 mcg IV and Versed 5 mg IV  TOPICAL ANESTHETICused DESCRIPTION OF PROCEDURE:   After the risks benefits and alternatives of the procedure were thoroughly explained, informed consent was obtained.  The Pentax Gastroscope [id number]  endoscope was introduced through the mouth and advanced to the third portion of the duodenum , limited by Without limitations.   The instrument was slowly withdrawn as the mucosa was fully examined.the findings are recorded below and there was no signs of active bleeding and the patient tolerated the procedure well there was no obvious immediate complication         FINDINGS:1. Large hiatal hernia with Z line at 30 cm 2. 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 Mild bulbitis4. Doubtful scalloping of third portion of the duodenum status post biopsy 5. Otherwise within normal limits EGD  COMPLICATIONS:none ENDOSCOPIC IMPRESSION:anemia questionably from large hiatal hernia and Cameron lesions   RECOMMENDATIONS:await biopsy still needs a colonoscopy either as inpatient or outpatient please let me know otherwise can advance diet add oral iron use pump inhibitors once a day hold off on aspirin and followup with my partner Dr. Randa Evens in 3-4 weeks to set up an outpatient colonoscopy   REPEAT EXAM: as needed   _______________________________ Vida Rigger, MD eSigned:  Vida Rigger, MD 12/29/2011 9:31 AM    CC:  PATIENT NAME:  [name] [name], [name] [name] MR#: [id number]

## 2011-12-29 NOTE — Progress Notes (Signed)
[facility] PROGRESS NOTE  [name] [name] WUJ:[id number] DOB: 1933-02-01 DOA: 12/27/2011 PCP: No primary provider on file.  Assessment/Plan: Acute blood loss anemia-symptomatic  -Appreciate gastroenterology assistance  - EGD w/o acute bleeding and just showing some cameron lesions and irritation in the duodenum (biosy taken and pending) -Hgb stable after transfusion. -Will advance diet -continue PPI BID by mouth -start iron pills by mouth.  Dyspnea due to anemia/systolic murmur  Given patient's age and underlying anemia need to rule out related to cardiac ischemia. -CE's negative -2-D echo WNL.  Iron (Fe) deficiency anemia  -Per GI IV iron given while in the hospital -will now discharge on PO iron. -Follow up in 3-4 weeks with Dr. Randa Evens for outpatient colonoscopy. -TSH, B12, folic acid were normal   Routine health screening  -Patient endorses she would like to establish with Dr. Martha Clan as her primary care physician after discharge  -Patient has not had mammography since the 1970s and also has not undergone other routine screening such as cervical cancer screening and routine laboratory data  -colonoscopy as an outpatient by [facility].  DVT prophylaxis: SCDs  Code Status: Full Family Communication: daughter at bedside Disposition Plan: advance diet, follow Hgb level in am, if stable home in am.   Consultants:  GI  Procedures:  EGD (see GI report for details, but no active bleeding seen at this moment)  Antibiotics:  none  HPI/Subjective: Afebrile; denies any blood in stool. No dizziness, no HA, no CP or SOB.  Objective: Filed Vitals:   12/29/11 0920 12/29/11 0930 12/29/11 0942 12/29/11 1407  BP: 154/65 152/55 143/58 151/69  Pulse:    75  Temp:    98.2 F (36.8 C)  TempSrc:    Oral  Resp: 23 19 17    Height:      Weight:      SpO2: 95% 95% 94% 96%    Intake/Output Summary (Last 24 hours) at 12/29/11 1723 Last data filed at 12/29/11 1321  Gross per 24 hour  Intake 4749.17 ml  Output      0 ml  Net 4749.17 ml   Filed Weights   12/27/11 2257 12/28/11 0318 12/28/11 1830  Weight: 82.555 kg (182 lb) 82.2 kg (181 lb 3.5 oz) 82.8 kg (182 lb 8.7 oz)    Exam:   General:  NAD, feeling better and stronger. Still Pale  Cardiovascular: S1 and S2, no rubs or gallops  Respiratory: CTA bilaterally  Abdomen: soft, NT, ND, positive BS  Neuro: non focal  Data Reviewed: Basic Metabolic Panel:  Lab 12/29/11 9562 12/28/11 0826 12/28/11 0046 12/27/11 2329  NA 141 139 141 140  K 3.9 3.7 4.1 4.1  CL 109 106 107 106  CO2 22 22 -- 23  GLUCOSE 85 88 98 105*  BUN 8 13 16 16   CREATININE 0.66 0.71 0.90 0.76  CALCIUM 8.4 8.2* -- 8.5  MG -- -- -- --  PHOS -- -- -- --   Liver Function Tests:  Lab 12/27/11 2329  AST 12  ALT 9  ALKPHOS 74  BILITOT 0.4  PROT 6.2  ALBUMIN 3.2*   CBC:  Lab 12/29/11 0520 12/28/11 2306 12/28/11 0826 12/28/11 0046 12/27/11 2329  WBC 8.8 8.7 7.5 -- 8.4  NEUTROABS -- -- -- -- 5.9  HGB 7.9* 7.8* 5.8* 4.8* 3.6*  HCT 26.4* 26.2* 20.4* 14.0* 14.8*  MCV 65.5* 65.7* 61.3* -- 53.8*  PLT 304 307 294 -- 342   Cardiac Enzymes:  Lab 12/28/11 1945 12/28/11 1335  12/28/11 0826  CKTOTAL -- -- --  CKMB -- -- --  CKMBINDEX -- -- --  TROPONINI <0.30 <0.30 <0.30     Recent Results (from the past 240 hour(s))  MRSA PCR SCREENING     Status: Normal   Collection Time   12/28/11  3:10 AM      Component Value Range Status Comment   MRSA by PCR NEGATIVE  NEGATIVE Final      Studies: No results found.  Scheduled Meds:   . ferrous fumarate  1 tablet Oral BID  . [COMPLETED] iron dextran (INFED/DEXFERRUM) infusion  25 mg Intravenous Once  . [COMPLETED] LORazepam  0.5 mg Oral Once  . pantoprazole  40 mg Oral BID AC  . [DISCONTINUED] pantoprazole  40 mg Oral Daily   Continuous Infusions:   . [DISCONTINUED] sodium chloride 500 mL (12/29/11 0831)  . [DISCONTINUED] sodium chloride      Time spent: >30  minutes    [name] [name]  [facility] Pager [phone]. If 8PM-8AM, please contact night-coverage at [email or web address], password [facility] 12/29/2011, 5:23 PM  LOS: 2 days

## 2011-12-29 NOTE — Evaluation (Signed)
Physical Therapy Evaluation Patient Details Name: [name] [name] MRN: [id number] DOB: Sep 11, 1933 Today's Date: 12/29/2011 Time: 7829-5621 PT Time Calculation (min): 12 min  PT Assessment / Plan / Recommendation Clinical Impression  [name] [name] is 78 y/o female admitted with severe anemia, Hgb: 3.5. Presents to PT today back to baseline with mobility with no concerns for balance. Did note slight dyspnea 1/4 after ambulation but pt with good understanding of progressing activity slowly with short rest breaks as needed. No further PT needs at this time.     PT Assessment  Patent does not need any further PT services    Follow Up Recommendations  No PT follow up    Does the patient have the potential to tolerate intense rehabilitation      Barriers to Discharge        Equipment Recommendations  None recommended by PT    Recommendations for Other Services     Frequency      Precautions / Restrictions Precautions Precautions: None Restrictions Weight Bearing Restrictions: No   Pertinent Vitals/Pain No c/o pain      Mobility  Bed Mobility Bed Mobility: Supine to Sit Supine to Sit: 6: Modified independent (Device/Increase time);HOB elevated (40 degrees) Transfers Transfers: Sit to Stand;Stand to Sit Sit to Stand: 6: Independent;From bed Stand to Sit: 6: Independent;To bed Ambulation/Gait Ambulation/Gait Assistance: 6: Modified independent (Device/Increase time) Ambulation Distance (Feet): 250 Feet Assistive device: None General Gait Details: ambulates without assistive device, no LOB noted, able to turn head both directs without slowing down and adjust speed for obstacles adequately Stairs: Yes Stairs Assistance: 6: Modified independent (Device/Increase time) Stair Management Technique: One rail Right (pt reports she steadies herself with wall ) Number of Stairs: 2          Visit Information  Last PT Received On: 12/29/11 Assistance Needed: +1    Subjective Data  Subjective: Im ready to go home!   Prior Functioning  Home Living Lives With: Alone Available Help at Discharge: Family;Available PRN/intermittently Type of Home: House Home Access: Stairs to enter [employer] of Steps: 2 Entrance Stairs-Rails: None Home Layout: One level Bathroom Shower/Tub: Health visitor: Standard Home Adaptive Equipment: Grab bars in shower;Shower chair with back Prior Function Level of Independence: Independent Able to Take Stairs?: Yes Driving: Yes Vocation: Retired Musician: No difficulties    Cognition  Overall Cognitive Status: Appears within functional limits for tasks assessed/performed Arousal/Alertness: Awake/alert Orientation Level: Appears intact for tasks assessed Behavior During Session: [facility] for tasks performed    Extremity/Trunk Assessment Right Upper Extremity Assessment RUE ROM/Strength/Tone: [facility] for tasks assessed Left Upper Extremity Assessment LUE ROM/Strength/Tone: [facility] for tasks assessed Right Lower Extremity Assessment RLE ROM/Strength/Tone: [facility] for tasks assessed Left Lower Extremity Assessment LLE ROM/Strength/Tone: WFL for tasks assessed   Balance    End of Session PT - End of Session Equipment Utilized During Treatment: Gait belt Activity Tolerance: Patient tolerated treatment well Patient left: in bed;with call bell/phone within reach;with family/visitor present (pt sitting EOB) Nurse Communication: Mobility status  GP     [name] 12/29/2011, 2:23 PM

## 2011-12-30 DIAGNOSIS — I10 Essential (primary) hypertension: Secondary | ICD-10-CM

## 2011-12-30 LAB — CBC
HCT: 26.6 % — ABNORMAL LOW (ref 36.0–46.0)
Hemoglobin: 7.8 g/dL — ABNORMAL LOW (ref 12.0–15.0)
MCH: 19.9 pg — ABNORMAL LOW (ref 26.0–34.0)
MCHC: 29.3 g/dL — ABNORMAL LOW (ref 30.0–36.0)
RBC: 3.92 MIL/uL (ref 3.87–5.11)

## 2011-12-30 MED ORDER — SODIUM CHLORIDE 0.9 % IJ SOLN
3.0000 mL | Freq: Two times a day (BID) | INTRAMUSCULAR | Status: DC
  Administered 2011-12-30: 3 mL via INTRAVENOUS

## 2011-12-30 MED ORDER — AMLODIPINE BESYLATE 5 MG PO TABS: 5.0000 mg | ORAL_TABLET | Freq: Every day | ORAL | Status: DC

## 2011-12-30 MED ORDER — FERROUS FUMARATE 325 (106 FE) MG PO TABS: 1.0000 | ORAL_TABLET | Freq: Two times a day (BID) | ORAL | Status: DC

## 2011-12-30 MED ORDER — PANTOPRAZOLE SODIUM 40 MG PO TBEC: 40.0000 mg | DELAYED_RELEASE_TABLET | Freq: Two times a day (BID) | ORAL | Status: DC

## 2011-12-30 NOTE — Discharge Summary (Signed)
Physician Discharge Summary  [name] [name] AVW:[id number] DOB: 08-Aug-1933 DOA: 12/27/2011  PCP: No primary provider on file.  Admit date: 12/27/2011 Discharge date: 12/30/2011  Time spent: >30 minutes  Recommendations for Outpatient Follow-up:  -follow up with in 1-2 weeks with PCP (she will establish care with Dr. Clelia Croft) -needs CBC to follow Hgb level -Follow BP and adjust medications as needed. -Colonoscopy to be arranged with Dr. Randa Evens in 3-4 weeks as an outpatient.  Discharge Diagnoses:  Principal Problem:  *Acute blood loss anemia-symptomatic Active Problems:  Dyspnea due to anemia  Iron (Fe) deficiency anemia   Discharge Condition: stable and improved. Patient will go home with outpatient colonoscopy by [facility] in 3-4 weeks. Patient advised to avoid NSAID's and to take medications as prescribed. She will like to follow with her daughter PCP and will arrange follow up appointment to establish care. Needs BP reevaluated and medications adjusted as needed.  Diet recommendation: heart healthy diet  Filed Weights   12/27/11 2257 12/28/11 0318 12/28/11 1830  Weight: 82.555 kg (182 lb) 82.2 kg (181 lb 3.5 oz) 82.8 kg (182 lb 8.7 oz)    History of present illness:  78 yo female with no PCP and no significant past medical history who presents to [facility] ED with main concern of progressively worsening shortness of breath that initially started one week prior to admission, worse with exertion and associated with generalized weakness. She lives in [place] and has seen one doctor for shortness of breath and blood work was done. She was called next day and told she has severe anemia and needs to come to the hospital for further evaluation. She report being in her usual state of health, never had similar events in the past. She denies chest pain, fevers, chills, other systemic symptoms, no abdominal or urinary concerns.    Hospital Course:  Acute blood loss anemia-symptomatic  -Appreciate  gastroenterology assistance  - EGD w/o acute bleeding and just showing some cameron lesions and irritation in the duodenum (biosy taken and pending)  -Hgb stable after transfusion; and at discharge 7.8.  -Tolerating full diet -continue PPI BID by mouth  -discharge on iron pills by mouth BID -Patient will follow with Dr. Randa Evens (GI) in 3-4 weeks for outpatient colonoscopy   Dyspnea due to anemia/systolic murmur  Given patient's age and underlying anemia need to rule out related to cardiac ischemia.  -CE's negative  -2-D echo WNL.   HTN -started on amlodipine 5mg  daily and advised to follow heart healthy diet -Follow up with PCP in 1-2 weeks.  Iron (Fe) deficiency anemia  -Per GI IV iron given while in the hospital  -will now discharge on PO iron.  -Follow up in 3-4 weeks with Dr. Randa Evens for outpatient colonoscopy.  -TSH, B12, folic acid were normal   Routine health screening  -Patient endorses she would like to establish with Dr. Martha Clan as her primary care physician after discharge  -Patient has not had mammography since the 1970s and also has not undergone other routine screening such as cervical cancer screening and routine laboratory data  -colonoscopy as an outpatient by [facility] GI.   Procedures:  EGD  Consultations:  GI  Discharge Exam: Filed Vitals:   12/29/11 0942 12/29/11 1407 12/29/11 2142 12/30/11 0456  BP: 143/58 151/69 172/65 161/52  Pulse:  75 69 73  Temp:  98.2 F (36.8 C) 98.5 F (36.9 C) 98.3 F (36.8 C)  TempSrc:  Oral Oral Oral  Resp: 17  17 17  Height:      Weight:      SpO2: 94% 96% 95% 95%    General: NAD, tolerating full diet, no more bleeding Cardiovascular: RRR, no rubs or gallops Respiratory: CTA bilaterally Abdomen: soft, NT, ND, positive BS Extremities: no edema or cyanosis Neuro: non focal  Discharge Instructions  Discharge Orders    Future Orders Please Complete By Expires   Diet - low sodium heart healthy       Discharge instructions      Comments:   -Take medications as prescribed -Follow with Dr. Randa Evens in 3-4 weeks for colonoscopy -Follow a heart healthy diet -Keep yourself well hydrated -arrange follow up with Dr. Clelia Croft to establish relationship with primary care physician and arrange evaluation/treatment for further healthcare needs. -avoid ASA or NSAID's (motrin, Aleve, advil, excedrin, etc...) -Use tylenol OTC for pain 650 mg every 6 hours as needed for pain.       Medication List     As of 12/30/2011 12:17 PM    STOP taking these medications         Potassium 95 MG Tabs      TAKE these medications         amLODipine 5 MG tablet   Commonly known as: NORVASC   Take 1 tablet (5 mg total) by mouth daily.      ferrous fumarate 325 (106 FE) MG Tabs   Commonly known as: HEMOCYTE - 106 mg FE   Take 1 tablet (106 mg of iron total) by mouth 2 (two) times daily.      pantoprazole 40 MG tablet   Commonly known as: PROTONIX   Take 1 tablet (40 mg total) by mouth 2 (two) times daily before a meal.           Follow-up Information    Follow up with Kari Baars, MD. Schedule an appointment as soon as possible for a visit in 2 weeks.   Contact information:   [address] [facility] [place] [address] [phone]       Follow up with EDWARDS JR,JAMES L, MD. Schedule an appointment as soon as possible for a visit in 3 weeks.   Contact information:   [address]                         [place] [address] [phone]           The results of significant diagnostics from this hospitalization (including imaging, microbiology, ancillary and laboratory) are listed below for reference.    Significant Diagnostic Studies: No results found.  Microbiology: Recent Results (from the past 240 hour(s))  MRSA PCR SCREENING     Status: Normal   Collection Time   12/28/11  3:10 AM      Component Value Range Status Comment    MRSA by PCR NEGATIVE  NEGATIVE Final      Labs: Basic Metabolic Panel:  Lab 12/29/11 5784 12/28/11 0826 12/28/11 0046 12/27/11 2329  NA 141 139 141 140  K 3.9 3.7 4.1 4.1  CL 109 106 107 106  CO2 22 22 -- 23  GLUCOSE 85 88 98 105*  BUN 8 13 16 16   CREATININE 0.66 0.71 0.90 0.76  CALCIUM 8.4 8.2* -- 8.5  MG -- -- -- --  PHOS -- -- -- --   Liver Function Tests:  Lab 12/27/11 2329  AST 12  ALT 9  ALKPHOS  74  BILITOT 0.4  PROT 6.2  ALBUMIN 3.2*   CBC:  Lab 12/30/11 0605 12/29/11 0520 12/28/11 2306 12/28/11 0826 12/28/11 0046 12/27/11 2329  WBC 10.6* 8.8 8.7 7.5 -- 8.4  NEUTROABS -- -- -- -- -- 5.9  HGB 7.8* 7.9* 7.8* 5.8* 4.8* --  HCT 26.6* 26.4* 26.2* 20.4* 14.0* --  MCV 67.9* 65.5* 65.7* 61.3* -- 53.8*  PLT 305 304 307 294 -- 342   Cardiac Enzymes:  Lab 12/28/11 1945 12/28/11 1335 12/28/11 0826  CKTOTAL -- -- --  CKMB -- -- --  CKMBINDEX -- -- --  TROPONINI <0.30 <0.30 <0.30    Signed:  Jonavan Vanhorn  [facility] 12/30/2011, 12:17 PM

## 2011-12-30 NOTE — Progress Notes (Signed)
12/30/11 Patient to be discharged home today. IV site removed and discharge instructions reviewed with patient.

## 2011-12-30 NOTE — Progress Notes (Signed)
[name] [name] 1:21 PM  Subjective: Patient without any GI complaints and no problem from her endoscopy and we reviewed those findings  Objective: Vital signs stable afebrile no acute distress hemoglobin stable  Assessment: Iron deficiency questionably from hiatal hernia and Cameron ulcers  Plan: She will follow up with me or my partner for repeat CBC and to set up a colonoscopy as an outpatient [facility]

## 2011-12-31 ENCOUNTER — Encounter (HOSPITAL_COMMUNITY): Payer: Self-pay | Admitting: Gastroenterology

## 2011-12-31 NOTE — Care Management Note (Signed)
    Page 1 of 1   12/31/2011     4:06:16 PM   CARE MANAGEMENT NOTE 12/31/2011  Patient:  [name] [name]   Account Number:  [id number]  Date Initiated:  12/28/2011  Documentation initiated by:  Alvira Philips Assessment:   78 yr-old female adm with dx of anemia; lives alone, independent PTA     Action/Plan:   Anticipated DC Date:  12/30/2011   Anticipated DC Plan:  HOME/SELF CARE      DC Planning Services  CM consult      Choice offered to / List presented to:             Status of service:  Completed, signed off Medicare Important Message given?   (If response is "NO", the following Medicare IM given date fields will be blank) Date Medicare IM given:   Date Additional Medicare IM given:    Discharge Disposition:  HOME/SELF CARE  Per UR Regulation:  Reviewed for med. necessity/level of care/duration of stay  If discussed at Long Length of Stay Meetings, dates discussed:    Comments:

## 2012-03-11 ENCOUNTER — Other Ambulatory Visit: Payer: Self-pay | Admitting: Gastroenterology

## 2013-10-11 ENCOUNTER — Emergency Department (INDEPENDENT_AMBULATORY_CARE_PROVIDER_SITE_OTHER)
Admission: EM | Admit: 2013-10-11 | Discharge: 2013-10-11 | Disposition: A | Discharge status: Home / Self Care | Payer: Managed Care, Other (non HMO) | Source: Home / Self Care

## 2013-10-11 ENCOUNTER — Encounter (HOSPITAL_COMMUNITY): Payer: Self-pay | Admitting: Emergency Medicine

## 2013-10-11 DIAGNOSIS — M940 Chondrocostal junction syndrome [Tietze]: Secondary | ICD-10-CM

## 2013-10-11 DIAGNOSIS — B029 Zoster without complications: Secondary | ICD-10-CM

## 2013-10-11 DIAGNOSIS — M7918 Myalgia, other site: Secondary | ICD-10-CM

## 2013-10-11 DIAGNOSIS — R079 Chest pain, unspecified: Secondary | ICD-10-CM

## 2013-10-11 DIAGNOSIS — R0789 Other chest pain: Secondary | ICD-10-CM

## 2013-10-11 DIAGNOSIS — R0781 Pleurodynia: Secondary | ICD-10-CM

## 2013-10-11 LAB — POCT I-STAT, CHEM 8
BUN: 12 mg/dL (ref 6–23)
CALCIUM ION: 1.15 mmol/L (ref 1.13–1.30)
CHLORIDE: 105 meq/L (ref 96–112)
Creatinine, Ser: 0.7 mg/dL (ref 0.50–1.10)
Glucose, Bld: 99 mg/dL (ref 70–99)
HEMATOCRIT: 48 % — AB (ref 36.0–46.0)
Hemoglobin: 16.3 g/dL — ABNORMAL HIGH (ref 12.0–15.0)
Potassium: 4.1 mEq/L (ref 3.7–5.3)
Sodium: 140 mEq/L (ref 137–147)
TCO2: 24 mmol/L (ref 0–100)

## 2013-10-11 MED ORDER — TRAMADOL HCL 50 MG PO TABS: 50.0000 mg | ORAL_TABLET | Freq: Four times a day (QID) | ORAL | Status: DC | PRN

## 2013-10-11 MED ORDER — VALACYCLOVIR HCL 1 G PO TABS: 1000.0000 mg | ORAL_TABLET | Freq: Three times a day (TID) | ORAL | Status: DC

## 2013-10-11 NOTE — ED Notes (Signed)
C/o 5 day duration of pain in back, radiating into chest. pain constant, worse w certain positions. Raised red rash noted left rib left flank, left nipple. Denies N/V/sweating. Prior hist of shingles

## 2013-10-11 NOTE — ED Provider Notes (Signed)
CSN: [id number]     Arrival date & time 10/11/13  1144 History   First MD Initiated Contact with Patient 10/11/13 1150     Chief Complaint  Patient presents with  . Back Pain   (Consider location/radiation/quality/duration/timing/severity/associated sxs/prior Treatment) HPI Comments: 80 year old female presents with complaints of to the left anterior chest, left lateral chest and left and right back. Days ago she reached up to pull an object down and experienced acute pain in the right posterior shoulder and upper back. She was feeling better for a couple of days until yesterday when the pain in her back got worse. She also notices a rash to the left breast and lateral chest. Pain is worse when lying on her back. No heaviness, tightness, pressure or fullness to chest.  There is an additional L parasternal chest pain described as sharp, started yesterday.  No dyspnea, diaphoresis or GI sx's.  No known hx of CAD or pulmonary dz.   Past Medical History  Diagnosis Date  . Anemia     hgb 3.5 on blood drawn 12/4   Past Surgical History  Procedure Laterality Date  . Appendectomy    . Esophagogastroduodenoscopy  12/29/2011    Procedure: ESOPHAGOGASTRODUODENOSCOPY (EGD);  Surgeon: Petra Kuba, MD;  Location: [facility] ENDOSCOPY;  Service: Endoscopy;  Laterality: N/A;   History reviewed. No pertinent family history. History  Substance Use Topics  . Smoking status: Never Smoker   . Smokeless tobacco: Not on file  . Alcohol Use: No   OB History   Grav Para Term Preterm Abortions TAB SAB Ect Mult Living                 Review of Systems  Constitutional: Positive for activity change. Negative for fever and fatigue.  HENT: Negative.   Respiratory: Negative for cough, chest tightness, shortness of breath and wheezing.   Cardiovascular: Positive for chest pain. Negative for palpitations and leg swelling.  Gastrointestinal: Negative.   Genitourinary: Negative.   Musculoskeletal: Positive for back  pain. Negative for gait problem and neck pain.  Skin: Positive for pallor. Negative for rash.  Neurological: Negative for tremors, syncope, facial asymmetry, speech difficulty and headaches.    Allergies  Review of patient's allergies indicates no known allergies.  Home Medications   Prior to Admission medications   Medication Sig Start Date End Date Taking? Authorizing Provider  amLODipine (NORVASC) 5 MG tablet Take 1 tablet (5 mg total) by mouth daily. 12/30/11  Yes Vassie Loll, MD  ferrous fumarate (HEMOCYTE - 106 MG FE) 325 (106 FE) MG TABS Take 1 tablet (106 mg of iron total) by mouth 2 (two) times daily. 12/30/11  Yes Vassie Loll, MD  pantoprazole (PROTONIX) 40 MG tablet Take 1 tablet (40 mg total) by mouth 2 (two) times daily before a meal. 12/30/11   Vassie Loll, MD  traMADol (ULTRAM) 50 MG tablet Take 1 tablet (50 mg total) by mouth every 6 (six) hours as needed. 10/11/13   Hayden Rasmussen, NP  valACYclovir (VALTREX) 1000 MG tablet Take 1 tablet (1,000 mg total) by mouth 3 (three) times daily. 10/11/13   Hayden Rasmussen, NP   BP 145/67  Pulse 79  Temp(Src) 98 F (36.7 C) (Oral)  SpO2 97% Physical Exam  Nursing note and vitals reviewed. Constitutional: She is oriented to person, place, and time. She appears well-developed and well-nourished. No distress.  Eyes: Conjunctivae and EOM are normal.  Neck: Normal range of motion. Neck supple.  Cardiovascular: Normal rate, regular  rhythm, normal heart sounds and intact distal pulses.   Pulmonary/Chest: Effort normal and breath sounds normal. No respiratory distress. She has no wheezes. She has no rales.  Musculoskeletal: She exhibits no edema.  Tenderness to the L lower posterior ribs and musculature. Tenderness Right upper back musculature and trapezius.   Neurological: She is alert and oriented to person, place, and time. She exhibits normal muscle tone.  Skin: Skin is warm and dry.  There is a red macular and a papulovesicular rash over  a red base along a T6, T7 dermatone from the spine to the sternum on the left side. Minor tenderness.   Psychiatric: She has a normal mood and affect.    ED Course  Procedures (including critical care time) Labs Review Labs Reviewed  POCT I-STAT, CHEM 8 - Abnormal; Notable for the following:    Hemoglobin 16.3 (*)    HCT 48.0 (*)    All other components within normal limits    Imaging Review No results found. EKG: NSR, no ectopy. No ischemic changes. No change from EKG 2013.  MDM   1. Herpes zoster   2. Intercostal muscle pain   3. Rib pain   4. Costochondritis, acute    Tramadol Aleve Valtrex Discused red flags for chest pain. G-dtr present.     Hayden Rasmussen, NP 10/11/13 1321

## 2013-10-11 NOTE — Discharge Instructions (Signed)
Costochondritis Costochondritis is a condition in which the tissue (cartilage) that connects your ribs with your breastbone (sternum) becomes irritated. It causes pain in the chest and rib area. It usually goes away on its own over time. HOME CARE  Avoid activities that wear you out.  Do not strain your ribs. Avoid activities that use your:  Chest.  Belly.  Side muscles.  Put ice on the area for the first 2 days after the pain starts.  Put ice in a plastic bag.  Place a towel between your skin and the bag.  Leave the ice on for 20 minutes, 2-3 times a day.  Only take medicine as told by your doctor. GET HELP IF:  You have redness or puffiness (swelling) in the rib area.  Your pain does not go away with rest or medicine. GET HELP RIGHT AWAY IF:   Your pain gets worse.  You are very uncomfortable.  You have trouble breathing.  You cough up blood.  You start sweating or throwing up (vomiting).  You have a fever or lasting symptoms for more than 2-3 days.  You have a fever and your symptoms suddenly get worse. MAKE SURE YOU:   Understand these instructions.  Will watch your condition.  Will get help right away if you are not doing well or get worse. Document Released: 06/27/2007 Document Revised: 09/10/2012 Document Reviewed: 08/12/2012 [facility] Patient Information 2015 [place], [place]. This information is not intended to replace advice given to you by your health care provider. Make sure you discuss any questions you have with your health care provider.  Chest Wall Pain Chest wall pain is pain in or around the bones and muscles of your chest. It may take up to 6 weeks to get better. It may take longer if you must stay physically active in your work and activities.  CAUSES  Chest wall pain may happen on its own. However, it may be caused by:  A viral illness like the flu.  Injury.  Coughing.  Exercise.  Arthritis.  Fibromyalgia.  Shingles. HOME CARE  INSTRUCTIONS   Avoid overtiring physical activity. Try not to strain or perform activities that cause pain. This includes any activities using your chest or your abdominal and side muscles, especially if heavy weights are used.  Put ice on the sore area.  Put ice in a plastic bag.  Place a towel between your skin and the bag.  Leave the ice on for 15-20 minutes per hour while awake for the first 2 days.  Only take over-the-counter or prescription medicines for pain, discomfort, or fever as directed by your caregiver. SEEK IMMEDIATE MEDICAL CARE IF:   Your pain increases, or you are very uncomfortable.  You have a fever.  Your chest pain becomes worse.  You have new, unexplained symptoms.  You have nausea or vomiting.  You feel sweaty or lightheaded.  You have a cough with phlegm (sputum), or you cough up blood. MAKE SURE YOU:   Understand these instructions.  Will watch your condition.  Will get help right away if you are not doing well or get worse. Document Released: 01/08/2005 Document Revised: 04/02/2011 Document Reviewed: 09/04/2010 [facility] Patient Information 2015 [place], [place]. This information is not intended to replace advice given to you by your health care provider. Make sure you discuss any questions you have with your health care provider.  Shingles Shingles (herpes zoster) is an infection that is caused by the same virus that causes chickenpox (varicella). The infection  causes a painful skin rash and fluid-filled blisters, which eventually break open, crust over, and heal. It may occur in any area of the body, but it usually affects only one side of the body or face. The pain of shingles usually lasts about 1 month. However, some people with shingles may develop long-term (chronic) pain in the affected area of the body. Shingles often occurs many years after the person had chickenpox. It is more common:  In people older than 50 years.  In people with  weakened immune systems, such as those with HIV, AIDS, or cancer.  In people taking medicines that weaken the immune system, such as transplant medicines.  In people under great stress. CAUSES  Shingles is caused by the varicella zoster virus (VZV), which also causes chickenpox. After a person is infected with the virus, it can remain in the person's body for years in an inactive state (dormant). To cause shingles, the virus reactivates and breaks out as an infection in a nerve root. The virus can be spread from person to person (contagious) through contact with open blisters of the shingles rash. It will only spread to people who have not had chickenpox. When these people are exposed to the virus, they may develop chickenpox. They will not develop shingles. Once the blisters scab over, the person is no longer contagious and cannot spread the virus to others. SIGNS AND SYMPTOMS  Shingles shows up in stages. The initial symptoms may be pain, itching, and tingling in an area of the skin. This pain is usually described as burning, stabbing, or throbbing.In a few days or weeks, a painful red rash will appear in the area where the pain, itching, and tingling were felt. The rash is usually on one side of the body in a band or belt-like pattern. Then, the rash usually turns into fluid-filled blisters. They will scab over and dry up in approximately 2-3 weeks. Flu-like symptoms may also occur with the initial symptoms, the rash, or the blisters. These may include:  Fever.  Chills.  Headache.  Upset stomach. DIAGNOSIS  Your health care provider will perform a skin exam to diagnose shingles. Skin scrapings or fluid samples may also be taken from the blisters. This sample will be examined under a microscope or sent to a lab for further testing. TREATMENT  There is no specific cure for shingles. Your health care provider will likely prescribe medicines to help you manage the pain, recover faster, and  avoid long-term problems. This may include antiviral drugs, anti-inflammatory drugs, and pain medicines. HOME CARE INSTRUCTIONS   Take a cool bath or apply cool compresses to the area of the rash or blisters as directed. This may help with the pain and itching.   Take medicines only as directed by your health care provider.   Rest as directed by your health care provider.  Keep your rash and blisters clean with mild soap and cool water or as directed by your health care provider.  Do not pick your blisters or scratch your rash. Apply an anti-itch cream or numbing creams to the affected area as directed by your health care provider.  Keep your shingles rash covered with a loose bandage (dressing).  Avoid skin contact with:  Babies.   Pregnant women.   Children with eczema.   Elderly people with transplants.   People with chronic illnesses, such as leukemia or AIDS.   Wear loose-fitting clothing to help ease the pain of material rubbing against the rash.  Keep all follow-up visits as directed by your health care provider.If the area involved is on your face, you may receive a referral for a specialist, such as an eye doctor (ophthalmologist) or an ear, nose, and throat (ENT) doctor. Keeping all follow-up visits will help you avoid eye problems, chronic pain, or disability.  SEEK IMMEDIATE MEDICAL CARE IF:   You have facial pain, pain around the eye area, or loss of feeling on one side of your face.  You have ear pain or ringing in your ear.  You have loss of taste.  Your pain is not relieved with prescribed medicines.   Your redness or swelling spreads.   You have more pain and swelling.  Your condition is worsening or has changed.   You have a fever. MAKE SURE YOU:  Understand these instructions.  Will watch your condition.  Will get help right away if you are not doing well or get worse. Document Released: 01/08/2005 Document Revised: 05/25/2013  Document Reviewed: 08/23/2011 [facility] Patient Information 2015 [place], [place]. This information is not intended to replace advice given to you by your health care provider. Make sure you discuss any questions you have with your health care provider.

## 2013-10-13 NOTE — ED Provider Notes (Signed)
Medical screening examination/treatment/procedure(s) were performed by a resident physician or non-physician practitioner and as the supervising physician I was immediately available for consultation/collaboration.  Marshel Golubski, MD    Ayisha Pol S Dariel Betzer, MD 10/13/13 0749 

## 2015-10-12 DIAGNOSIS — I10 Essential (primary) hypertension: Secondary | ICD-10-CM | POA: Diagnosis not present

## 2015-10-12 DIAGNOSIS — D649 Anemia, unspecified: Secondary | ICD-10-CM | POA: Diagnosis not present

## 2016-04-18 DIAGNOSIS — D649 Anemia, unspecified: Secondary | ICD-10-CM | POA: Diagnosis not present

## 2016-04-18 DIAGNOSIS — S61551A Open bite of right wrist, initial encounter: Secondary | ICD-10-CM | POA: Diagnosis not present

## 2016-04-18 DIAGNOSIS — H04422 Chronic lacrimal canaliculitis of left lacrimal passage: Secondary | ICD-10-CM | POA: Diagnosis not present

## 2016-04-18 DIAGNOSIS — I10 Essential (primary) hypertension: Secondary | ICD-10-CM | POA: Diagnosis not present

## 2016-04-25 DIAGNOSIS — S61551A Open bite of right wrist, initial encounter: Secondary | ICD-10-CM | POA: Diagnosis not present

## 2016-04-25 DIAGNOSIS — S61451D Open bite of right hand, subsequent encounter: Secondary | ICD-10-CM | POA: Diagnosis not present

## 2016-06-04 DIAGNOSIS — H52221 Regular astigmatism, right eye: Secondary | ICD-10-CM | POA: Diagnosis not present

## 2016-06-04 DIAGNOSIS — H2513 Age-related nuclear cataract, bilateral: Secondary | ICD-10-CM | POA: Diagnosis not present

## 2016-10-01 DIAGNOSIS — I10 Essential (primary) hypertension: Secondary | ICD-10-CM | POA: Diagnosis not present

## 2016-12-24 DIAGNOSIS — Z131 Encounter for screening for diabetes mellitus: Secondary | ICD-10-CM | POA: Diagnosis not present

## 2016-12-24 DIAGNOSIS — Z13220 Encounter for screening for lipoid disorders: Secondary | ICD-10-CM | POA: Diagnosis not present

## 2016-12-24 DIAGNOSIS — I10 Essential (primary) hypertension: Secondary | ICD-10-CM | POA: Diagnosis not present

## 2017-03-08 DIAGNOSIS — H01134 Eczematous dermatitis of left upper eyelid: Secondary | ICD-10-CM | POA: Diagnosis not present

## 2017-03-08 DIAGNOSIS — L989 Disorder of the skin and subcutaneous tissue, unspecified: Secondary | ICD-10-CM | POA: Diagnosis not present

## 2017-03-08 DIAGNOSIS — H02105 Unspecified ectropion of left lower eyelid: Secondary | ICD-10-CM | POA: Diagnosis not present

## 2017-03-25 DIAGNOSIS — E559 Vitamin D deficiency, unspecified: Secondary | ICD-10-CM | POA: Diagnosis not present

## 2017-03-25 DIAGNOSIS — D649 Anemia, unspecified: Secondary | ICD-10-CM | POA: Diagnosis not present

## 2017-06-26 DIAGNOSIS — I10 Essential (primary) hypertension: Secondary | ICD-10-CM | POA: Diagnosis not present

## 2017-06-26 DIAGNOSIS — D649 Anemia, unspecified: Secondary | ICD-10-CM | POA: Diagnosis not present

## 2017-09-25 DIAGNOSIS — I10 Essential (primary) hypertension: Secondary | ICD-10-CM | POA: Diagnosis not present

## 2018-03-25 DIAGNOSIS — I10 Essential (primary) hypertension: Secondary | ICD-10-CM | POA: Diagnosis not present

## 2018-03-25 DIAGNOSIS — D649 Anemia, unspecified: Secondary | ICD-10-CM | POA: Diagnosis not present

## 2018-09-24 DIAGNOSIS — Z79899 Other long term (current) drug therapy: Secondary | ICD-10-CM | POA: Diagnosis not present

## 2018-09-24 DIAGNOSIS — I10 Essential (primary) hypertension: Secondary | ICD-10-CM | POA: Diagnosis not present

## 2018-09-24 DIAGNOSIS — D649 Anemia, unspecified: Secondary | ICD-10-CM | POA: Diagnosis not present

## 2018-12-23 DIAGNOSIS — H2513 Age-related nuclear cataract, bilateral: Secondary | ICD-10-CM | POA: Diagnosis not present

## 2018-12-23 DIAGNOSIS — H02132 Senile ectropion of right lower eyelid: Secondary | ICD-10-CM | POA: Diagnosis not present

## 2019-03-18 DIAGNOSIS — I10 Essential (primary) hypertension: Secondary | ICD-10-CM | POA: Diagnosis not present

## 2019-03-18 DIAGNOSIS — D649 Anemia, unspecified: Secondary | ICD-10-CM | POA: Diagnosis not present

## 2019-03-18 DIAGNOSIS — Z79899 Other long term (current) drug therapy: Secondary | ICD-10-CM | POA: Diagnosis not present

## 2019-04-21 ENCOUNTER — Other Ambulatory Visit (HOSPITAL_COMMUNITY): Payer: Self-pay | Admitting: Family

## 2019-04-21 ENCOUNTER — Other Ambulatory Visit: Payer: Self-pay | Admitting: Family

## 2019-04-21 ENCOUNTER — Ambulatory Visit (HOSPITAL_COMMUNITY)
Admission: RE | Admit: 2019-04-21 | Discharge: 2019-04-21 | Disposition: A | Discharge status: Home / Self Care | Payer: PPO | Source: Ambulatory Visit | Attending: Family | Admitting: Family

## 2019-04-21 ENCOUNTER — Other Ambulatory Visit: Payer: Self-pay

## 2019-04-21 ENCOUNTER — Ambulatory Visit (HOSPITAL_BASED_OUTPATIENT_CLINIC_OR_DEPARTMENT_OTHER)
Admission: RE | Admit: 2019-04-21 | Discharge: 2019-04-21 | Disposition: A | Discharge status: Home / Self Care | Payer: PPO | Source: Ambulatory Visit

## 2019-04-21 DIAGNOSIS — M79604 Pain in right leg: Secondary | ICD-10-CM

## 2019-04-21 DIAGNOSIS — R609 Edema, unspecified: Secondary | ICD-10-CM

## 2019-04-21 DIAGNOSIS — R601 Generalized edema: Secondary | ICD-10-CM | POA: Insufficient documentation

## 2019-04-21 DIAGNOSIS — R600 Localized edema: Secondary | ICD-10-CM | POA: Diagnosis not present

## 2019-04-21 DIAGNOSIS — M7989 Other specified soft tissue disorders: Secondary | ICD-10-CM

## 2019-04-21 DIAGNOSIS — I10 Essential (primary) hypertension: Secondary | ICD-10-CM | POA: Diagnosis not present

## 2019-04-21 DIAGNOSIS — R9431 Abnormal electrocardiogram [ECG] [EKG]: Secondary | ICD-10-CM | POA: Diagnosis not present

## 2019-04-21 NOTE — Progress Notes (Signed)
Right lower extremity venous duplex complete.  Please see CV Proc tab for preliminary results. Levin Bacon- RDMS, RVT 6:01 PM  04/21/2019

## 2019-05-07 ENCOUNTER — Ambulatory Visit: Payer: PPO | Admitting: Cardiology

## 2019-05-13 ENCOUNTER — Other Ambulatory Visit: Payer: Self-pay

## 2019-05-13 ENCOUNTER — Ambulatory Visit: Payer: Medicare HMO | Admitting: Cardiology

## 2019-05-13 ENCOUNTER — Encounter: Payer: Self-pay | Admitting: Cardiology

## 2019-05-13 VITALS — BP 150/70 | HR 77 | Ht 65.5 in | Wt 182.0 lb

## 2019-05-13 DIAGNOSIS — I493 Ventricular premature depolarization: Secondary | ICD-10-CM

## 2019-05-13 DIAGNOSIS — R600 Localized edema: Secondary | ICD-10-CM

## 2019-05-13 DIAGNOSIS — I10 Essential (primary) hypertension: Secondary | ICD-10-CM | POA: Insufficient documentation

## 2019-05-13 DIAGNOSIS — R011 Cardiac murmur, unspecified: Secondary | ICD-10-CM | POA: Insufficient documentation

## 2019-05-13 DIAGNOSIS — R0600 Dyspnea, unspecified: Secondary | ICD-10-CM

## 2019-05-13 HISTORY — DX: Localized edema: R60.0

## 2019-05-13 HISTORY — DX: Cardiac murmur, unspecified: R01.1

## 2019-05-13 HISTORY — DX: Ventricular premature depolarization: I49.3

## 2019-05-13 HISTORY — DX: Essential (primary) hypertension: I10

## 2019-05-13 MED ORDER — FUROSEMIDE 40 MG PO TABS: 40.0000 mg | ORAL_TABLET | Freq: Every day | ORAL | 12 refills | Status: AC

## 2019-05-13 NOTE — Progress Notes (Signed)
Cardiology Office Note:    Date:  05/13/2019   ID:  [name] [name], DOB 08-Dec-1933, MRN [id number]  PCP:  Imagene Riches, NP  Cardiologist:  Berniece Salines, DO  Electrophysiologist:  None   Referring MD: Imagene Riches, NP   Chief Complaint  Patient presents with  . Abnormal ECG    History of Present Illness:    [name] [name] is a 86 y.o. female with a hx of hypertension presents today to be evaluated at the request of her pcp for an abnormal ekg and bilateral leg edema. The patient tells me that she has been experiencing leg edema over the last couple of months. She notes that there is associated shortness of breath on exertion. She denies any chest pain.  Past Medical History:  Diagnosis Date  . Anemia    hgb 3.5 on blood drawn 12/4  . GERD (gastroesophageal reflux disease)   . Hypertension     Past Surgical History:  Procedure Laterality Date  . APPENDECTOMY    . ESOPHAGOGASTRODUODENOSCOPY  12/29/2011   Procedure: ESOPHAGOGASTRODUODENOSCOPY (EGD);  Surgeon: Jeryl Columbia, MD;  Location[facility] ENDOSCOPY;  Service: Endoscopy;  Laterality: N/A;    Current Medications: Current Meds  Medication Sig  . acetaminophen (TYLENOL) 500 MG tablet Take 500 mg by mouth every 6 (six) hours as needed.  Marland Kitchen amLODipine (NORVASC) 5 MG tablet Take 1 tablet (5 mg total) by mouth daily.  . Ascorbic Acid (VITA-C PO) Take 500 mg by mouth daily.  Marland Kitchen b complex vitamins tablet Take 1 tablet by mouth daily.  . Calcium Carb-Cholecalciferol (CALCIUM 600+D) 600-800 MG-UNIT TABS Take by mouth daily.  . Cholecalciferol (VITAMIN D3) 1.25 MG (50000 UT) CAPS Take by mouth daily.  . ferrous sulfate 325 (65 FE) MG tablet Take 325 mg by mouth daily with breakfast.  . Multiple Vitamins-Minerals (MULTIVITAMIN WITH MINERALS) tablet Take 1 tablet by mouth daily.  . Potassium 99 MG TABS Take by mouth daily.  . [DISCONTINUED] diphenhydrAMINE (BENADRYL) 25 MG tablet Take 25 mg by mouth in the morning, at noon, and at  bedtime.     Allergies:   Patient has no known allergies.   Social History   Socioeconomic History  . Marital status: Single    Spouse name: Not on file  . Number of children: Not on file  . Years of education: Not on file  . Highest education level: Not on file  Occupational History  . Not on file  Tobacco Use  . Smoking status: Never Smoker  . Smokeless tobacco: Never Used  Substance and Sexual Activity  . Alcohol use: No  . Drug use: No  . Sexual activity: Not on file  Other Topics Concern  . Not on file  Social History Narrative  . Not on file   Social Determinants of Health   Financial Resource Strain:   . Difficulty of Paying Living Expenses:   Food Insecurity:   . Worried About Charity fundraiser in the Last Year:   . Arboriculturist in the Last Year:   Transportation Needs:   . Film/video editor (Medical):   Marland Kitchen Lack of Transportation (Non-Medical):   Physical Activity:   . Days of Exercise per Week:   . Minutes of Exercise per Session:   Stress:   . Feeling of Stress :   Social Connections:   . Frequency of Communication with Friends and Family:   . Frequency of Social Gatherings with Friends and  Family:   . Attends Religious Services:   . Active Member of Clubs or Organizations:   . Attends Archivist Meetings:   Marland Kitchen Marital Status:      Family History: The patient's family history includes Cancer in her mother; Stroke in her father.  ROS:   Review of Systems  Constitution: Negative for decreased appetite, fever and weight gain.  HENT: Negative for congestion, ear discharge, hoarse voice and sore throat.   Eyes: Negative for discharge, redness, vision loss in right eye and visual halos.  Cardiovascular: Reports shortness of breath and leg swelling. Negative for chest pain, orthopnea and palpitations.  Respiratory: Negative for cough, hemoptysis, shortness of breath and snoring.   Endocrine: Negative for heat intolerance and  polyphagia.  Hematologic/Lymphatic: Negative for bleeding problem. Does not bruise/bleed easily.  Skin: Negative for flushing, nail changes, rash and suspicious lesions.  Musculoskeletal: Negative for arthritis, joint pain, muscle cramps, myalgias, neck pain and stiffness.  Gastrointestinal: Negative for abdominal pain, bowel incontinence, diarrhea and excessive appetite.  Genitourinary: Negative for decreased libido, genital sores and incomplete emptying.  Neurological: Negative for brief paralysis, focal weakness, headaches and loss of balance.  Psychiatric/Behavioral: Negative for altered mental status, depression and suicidal ideas.  Allergic/Immunologic: Negative for HIV exposure and persistent infections.    EKGs/Labs/Other Studies Reviewed:    The following studies were reviewed today:   EKG:  The ekg ordered today demonstrates NSR, HR 76 bpm with RBBB with rare pvc compare to EKg done at her pcp office No significant change.  Recent Labs: Labs from her pcp office done on 04/21/2019: Na 141, K 4.6, chloride 106, co2 27, BUN 22, glucose 88, cr 0.81, calcium 9.4, total protein 7.2, Total bilirubin 0.4, Alk phos 67, AST 16, ALT 15, uric acid 4.7 CBC: wbc 9.2, hgb 13.6, HCT 41.5, PLT 205. BNP 75 Recent Lipid Panel No results found for: CHOL, TRIG, HDL, CHOLHDL, VLDL, LDLCALC, LDLDIRECT  Physical Exam:    VS:  BP (!) 150/70 (BP Location: Right Arm, Patient Position: Sitting, Cuff Size: Large)   Pulse 77   Ht 5' 5.5" (1.664 m)   Wt 182 lb (82.6 kg)   SpO2 96%   BMI 29.83 kg/m     Wt Readings from Last 3 Encounters:  05/13/19 182 lb (82.6 kg)  12/28/11 182 lb 8.7 oz (82.8 kg)     GEN: Well nourished, well developed in no acute distress HEENT: Normal NECK: No JVD; No carotid bruits LYMPHATICS: No lymphadenopathy CARDIAC: S1S2 noted,RRR, 2/6 mid-late systolic ejection murmurs, rubs, gallops RESPIRATORY:  Clear to auscultation without rales, wheezing or rhonchi  ABDOMEN:  Soft, non-tender, non-distended, +bowel sounds, no guarding. EXTREMITIES: +1 bilateral leg edema, No cyanosis, no clubbing MUSCULOSKELETAL:  No deformity  SKIN: Warm and dry NEUROLOGIC:  Alert and oriented x 3, non-focal PSYCHIATRIC:  Normal affect, good insight  ASSESSMENT:    1. Systolic murmur   2. Dyspnea, unspecified type   3. Bilateral leg edema   4. Essential hypertension   5. PVC (premature ventricular contraction)    PLAN:     Systolic murmur on physical exam concerning for aortic stenosis. I will get a TTE to assess the murmur severity, LV function and other structural abnormalities.   Dyspnea on Exertion - this could be multifactorial. For now with get the Transthoracic echo as noted above. With her bilateral leg edema I will start her on Lasix 40 mg daily. She already takes potassium. We will reassess the need for  ischemic evaluation after her echo and response to diuretics.   Bilateral leg edema - start lasix for now. She is also on amlodipine which may be contributing to this. Recent Ultrasound duplex done on 04/22/19 with no report of DVT.  Rare PVC - asymptomatic, will continue to monitor.  Hypertension - bp slightly elevated today. With the start of lasix I am hoping this will improve.   The patient is in agreement with the above plan. The patient left the office in stable condition.  The patient will follow up in 1 month or sooner if needed.   Medication Adjustments/Labs and Tests Ordered: Current medicines are reviewed at length with the patient today.  Concerns regarding medicines are outlined above.  Orders Placed This Encounter  Procedures  . EKG 12-Lead  . ECHOCARDIOGRAM COMPLETE   Meds ordered this encounter  Medications  . furosemide (LASIX) 40 MG tablet    Sig: Take 1 tablet (40 mg total) by mouth daily.    Dispense:  30 tablet    Refill:  12    Patient Instructions  Medication Instructions:  Your physician has recommended you make the  following change in your medication:   Start Lasix 40 mg daily.  *If you need a refill on your cardiac medications before your next appointment, please call your pharmacy*   Lab Work: None ordered If you have labs (blood work) drawn today and your tests are completely normal, you will receive your results only by: Marland Kitchen MyChart Message (if you have MyChart) OR . A paper copy in the mail If you have any lab test that is abnormal or we need to change your treatment, we will call you to review the results.   Testing/Procedures: Your physician has requested that you have an echocardiogram. Echocardiography is a painless test that uses sound waves to create images of your heart. It provides your doctor with information about the size and shape of your heart and how well your heart's chambers and valves are working. This procedure takes approximately one hour. There are no restrictions for this procedure.   Follow-Up: At [facility], you and your health needs are our priority.  As part of our continuing mission to provide you with exceptional heart care, we have created designated Provider Care Teams.  These Care Teams include your primary Cardiologist (physician) and Advanced Practice Providers (APPs -  Physician Assistants and Nurse Practitioners) who all work together to provide you with the care you need, when you need it.  We recommend signing up for the patient portal called "MyChart".  Sign up information is provided on this After Visit Summary.  MyChart is used to connect with patients for Virtual Visits (Telemedicine).  Patients are able to view lab/test results, encounter notes, upcoming appointments, etc.  Non-urgent messages can be sent to your provider as well.   To learn more about what you can do with MyChart, go to [email or web address].    Your next appointment:   1 month(s)  The format for your next appointment:   In Person  Provider:   Berniece Salines, DO   Other  Instructions  Echocardiogram An echocardiogram is a procedure that uses painless sound waves (ultrasound) to produce an image of the heart. Images from an echocardiogram can provide important information about:  Signs of coronary artery disease (CAD).  Aneurysm detection. An aneurysm is a weak or damaged part of an artery wall that bulges out from the normal force of blood pumping through the body.  Heart size and shape. Changes in the size or shape of the heart can be associated with certain conditions, including heart failure, aneurysm, and CAD.  Heart muscle function.  Heart valve function.  Signs of a past heart attack.  Fluid buildup around the heart.  Thickening of the heart muscle.  A tumor or infectious growth around the heart valves. Tell a health care provider about:  Any allergies you have.  All medicines you are taking, including vitamins, herbs, eye drops, creams, and over-the-counter medicines.  Any blood disorders you have.  Any surgeries you have had.  Any medical conditions you have.  Whether you are pregnant or may be pregnant. What are the risks? Generally, this is a safe procedure. However, problems may occur, including:  Allergic reaction to dye (contrast) that may be used during the procedure. What happens before the procedure? No specific preparation is needed. You may eat and drink normally. What happens during the procedure?   An IV tube may be inserted into one of your veins.  You may receive contrast through this tube. A contrast is an injection that improves the quality of the pictures from your heart.  A gel will be applied to your chest.  A wand-like tool (transducer) will be moved over your chest. The gel will help to transmit the sound waves from the transducer.  The sound waves will harmlessly bounce off of your heart to allow the heart images to be captured in real-time motion. The images will be recorded on a computer. The  procedure may vary among health care providers and hospitals. What happens after the procedure?  You may return to your normal, everyday life, including diet, activities, and medicines, unless your health care provider tells you not to do that. Summary  An echocardiogram is a procedure that uses painless sound waves (ultrasound) to produce an image of the heart.  Images from an echocardiogram can provide important information about the size and shape of your heart, heart muscle function, heart valve function, and fluid buildup around your heart.  You do not need to do anything to prepare before this procedure. You may eat and drink normally.  After the echocardiogram is completed, you may return to your normal, everyday life, unless your health care provider tells you not to do that. This information is not intended to replace advice given to you by your health care provider. Make sure you discuss any questions you have with your health care provider. Document Revised: 05/01/2018 Document Reviewed: 02/11/2016 Elsevier Patient Education  [place].       Adopting a Healthy Lifestyle.  Know what a healthy weight is for you (roughly BMI <25) and aim to maintain this   Aim for 7+ servings of fruits and vegetables daily   65-80+ fluid ounces of water or unsweet tea for healthy kidneys   Limit to max 1 drink of alcohol per day; avoid smoking/tobacco   Limit animal fats in diet for cholesterol and heart health - choose grass fed whenever available   Avoid highly processed foods, and foods high in saturated/trans fats   Aim for low stress - take time to unwind and care for your mental health   Aim for 150 min of moderate intensity exercise weekly for heart health, and weights twice weekly for bone health   Aim for 7-9 hours of sleep daily   When it comes to diets, agreement about the perfect plan isnt easy to find, even among the experts. Experts  at the [employer] developed an idea known as the Healthy Eating Plate. Just imagine a plate divided into logical, healthy portions.   The emphasis is on diet quality:   Load up on vegetables and fruits - one-half of your plate: Aim for color and variety, and remember that potatoes dont count.   Go for whole grains - one-quarter of your plate: Whole wheat, barley, wheat berries, quinoa, oats, brown rice, and foods made with them. If you want pasta, go with whole wheat pasta.   Protein power - one-quarter of your plate: Fish, chicken, beans, and nuts are all healthy, versatile protein sources. Limit red meat.   The diet, however, does go beyond the plate, offering a few other suggestions.   Use healthy plant oils, such as olive, canola, soy, corn, sunflower and peanut. Check the labels, and avoid partially hydrogenated oil, which have unhealthy trans fats.   If youre thirsty, drink water. Coffee and tea are good in moderation, but skip sugary drinks and limit milk and dairy products to one or two daily servings.   The type of carbohydrate in the diet is more important than the amount. Some sources of carbohydrates, such as vegetables, fruits, whole grains, and beans-are healthier than others.   Finally, stay active  Signed, Berniece Salines, DO  05/13/2019 10:01 PM    [facility]

## 2019-05-13 NOTE — Patient Instructions (Signed)
Medication Instructions:  Your physician has recommended you make the following change in your medication:   Start Lasix 40 mg daily.  *If you need a refill on your cardiac medications before your next appointment, please call your pharmacy*   Lab Work: None ordered If you have labs (blood work) drawn today and your tests are completely normal, you will receive your results only by: Marland Kitchen MyChart Message (if you have MyChart) OR . A paper copy in the mail If you have any lab test that is abnormal or we need to change your treatment, we will call you to review the results.   Testing/Procedures: Your physician has requested that you have an echocardiogram. Echocardiography is a painless test that uses sound waves to create images of your heart. It provides your doctor with information about the size and shape of your heart and how well your heart's chambers and valves are working. This procedure takes approximately one hour. There are no restrictions for this procedure.   Follow-Up: At [facility], you and your health needs are our priority.  As part of our continuing mission to provide you with exceptional heart care, we have created designated Provider Care Teams.  These Care Teams include your primary Cardiologist (physician) and Advanced Practice Providers (APPs -  Physician Assistants and Nurse Practitioners) who all work together to provide you with the care you need, when you need it.  We recommend signing up for the patient portal called "MyChart".  Sign up information is provided on this After Visit Summary.  MyChart is used to connect with patients for Virtual Visits (Telemedicine).  Patients are able to view lab/test results, encounter notes, upcoming appointments, etc.  Non-urgent messages can be sent to your provider as well.   To learn more about what you can do with MyChart, go to [email or web address].    Your next appointment:   1 month(s)  The format for your next  appointment:   In Person  Provider:   Thomasene Ripple, DO   Other Instructions  Echocardiogram An echocardiogram is a procedure that uses painless sound waves (ultrasound) to produce an image of the heart. Images from an echocardiogram can provide important information about:  Signs of coronary artery disease (CAD).  Aneurysm detection. An aneurysm is a weak or damaged part of an artery wall that bulges out from the normal force of blood pumping through the body.  Heart size and shape. Changes in the size or shape of the heart can be associated with certain conditions, including heart failure, aneurysm, and CAD.  Heart muscle function.  Heart valve function.  Signs of a past heart attack.  Fluid buildup around the heart.  Thickening of the heart muscle.  A tumor or infectious growth around the heart valves. Tell a health care provider about:  Any allergies you have.  All medicines you are taking, including vitamins, herbs, eye drops, creams, and over-the-counter medicines.  Any blood disorders you have.  Any surgeries you have had.  Any medical conditions you have.  Whether you are pregnant or may be pregnant. What are the risks? Generally, this is a safe procedure. However, problems may occur, including:  Allergic reaction to dye (contrast) that may be used during the procedure. What happens before the procedure? No specific preparation is needed. You may eat and drink normally. What happens during the procedure?   An IV tube may be inserted into one of your veins.  You may receive contrast through this tube. A contrast  is an injection that improves the quality of the pictures from your heart.  A gel will be applied to your chest.  A wand-like tool (transducer) will be moved over your chest. The gel will help to transmit the sound waves from the transducer.  The sound waves will harmlessly bounce off of your heart to allow the heart images to be captured in  real-time motion. The images will be recorded on a computer. The procedure may vary among health care providers and hospitals. What happens after the procedure?  You may return to your normal, everyday life, including diet, activities, and medicines, unless your health care provider tells you not to do that. Summary  An echocardiogram is a procedure that uses painless sound waves (ultrasound) to produce an image of the heart.  Images from an echocardiogram can provide important information about the size and shape of your heart, heart muscle function, heart valve function, and fluid buildup around your heart.  You do not need to do anything to prepare before this procedure. You may eat and drink normally.  After the echocardiogram is completed, you may return to your normal, everyday life, unless your health care provider tells you not to do that. This information is not intended to replace advice given to you by your health care provider. Make sure you discuss any questions you have with your health care provider. Document Revised: 05/01/2018 Document Reviewed: 02/11/2016 Elsevier Patient Education  [place].

## 2019-06-04 ENCOUNTER — Emergency Department (HOSPITAL_COMMUNITY): Payer: Medicare HMO

## 2019-06-04 ENCOUNTER — Other Ambulatory Visit: Payer: Self-pay

## 2019-06-04 ENCOUNTER — Emergency Department (HOSPITAL_COMMUNITY)
Admission: EM | Admit: 2019-06-04 | Discharge: 2019-06-04 | Disposition: A | Discharge status: Home / Self Care | Payer: Medicare HMO | Attending: Emergency Medicine | Admitting: Emergency Medicine

## 2019-06-04 DIAGNOSIS — M5442 Lumbago with sciatica, left side: Secondary | ICD-10-CM | POA: Diagnosis not present

## 2019-06-04 DIAGNOSIS — M5432 Sciatica, left side: Secondary | ICD-10-CM

## 2019-06-04 DIAGNOSIS — Z79899 Other long term (current) drug therapy: Secondary | ICD-10-CM | POA: Insufficient documentation

## 2019-06-04 DIAGNOSIS — M25552 Pain in left hip: Secondary | ICD-10-CM | POA: Diagnosis present

## 2019-06-04 DIAGNOSIS — I10 Essential (primary) hypertension: Secondary | ICD-10-CM | POA: Diagnosis not present

## 2019-06-04 MED ORDER — LIDOCAINE 5 % EX PTCH: 1.0000 | MEDICATED_PATCH | CUTANEOUS | 0 refills | Status: DC

## 2019-06-04 MED ORDER — HYDROCODONE-ACETAMINOPHEN 5-325 MG PO TABS: 1.0000 | ORAL_TABLET | Freq: Three times a day (TID) | ORAL | 0 refills | Status: DC | PRN

## 2019-06-04 MED ORDER — HYDROCODONE-ACETAMINOPHEN 5-325 MG PO TABS
1.0000 | ORAL_TABLET | Freq: Once | ORAL | Status: AC
  Administered 2019-06-04: 1 via ORAL
  Filled 2019-06-04: qty 1

## 2019-06-04 MED ORDER — FENTANYL CITRATE (PF) 100 MCG/2ML IJ SOLN
50.0000 ug | Freq: Once | INTRAMUSCULAR | Status: AC
  Administered 2019-06-04: 50 ug via INTRAMUSCULAR
  Filled 2019-06-04: qty 2

## 2019-06-04 MED ORDER — DICLOFENAC SODIUM 1 % EX GEL: 2.0000 g | Freq: Four times a day (QID) | CUTANEOUS | 0 refills | Status: AC

## 2019-06-04 MED ORDER — LIDOCAINE 5 % EX PTCH
1.0000 | MEDICATED_PATCH | CUTANEOUS | Status: DC
  Administered 2019-06-04: 1 via TRANSDERMAL
  Filled 2019-06-04: qty 1

## 2019-06-04 NOTE — ED Triage Notes (Signed)
Pt c/o left hip pain radiating down legs started Wednesday after doing yard work; denies fall.

## 2019-06-04 NOTE — ED Provider Notes (Signed)
[facility] EMERGENCY DEPARTMENT Provider Note   CSN: [id number] Arrival date & time: 06/04/19  0749     History Chief Complaint  Patient presents with  . Hip Pain    [name] [name] is a 86 y.o. female.  HPI   86 year old female with a history of anemia, GERD, hypertension, who presents to the emergency department today for evaluation of left lower back pain.  Patient stated that she was doing yard work and lifted some heavy flowerpots 2 days ago.  That night she began having pain to the left lower back.  Pain is constant and severe in nature.  It is worse with certain movements and positions.  Pain radiates down the left lower extremity.  She denies any associated numbness, weakness or tingling to the extremities.  She has been ambulatory.  Denies any fevers, loss control of bowel or bladder function, urinary retention, history of cancer or IV drug use.  Denies any fevers.  Denies any other associated symptoms.  Denies any falls.  She is tried taking ibuprofen at home without significant relief.  Past Medical History:  Diagnosis Date  . Anemia    hgb 3.5 on blood drawn 12/4  . GERD (gastroesophageal reflux disease)   . Hypertension     Patient Active Problem List   Diagnosis Date Noted  . Systolic murmur 05/13/2019  . Bilateral leg edema 05/13/2019  . Essential hypertension 05/13/2019  . PVC (premature ventricular contraction) 05/13/2019  . Dyspnea due to anemia 12/28/2011  . Acute blood loss anemia-symptomatic 12/28/2011  . Iron (Fe) deficiency anemia 12/28/2011    Past Surgical History:  Procedure Laterality Date  . APPENDECTOMY    . ESOPHAGOGASTRODUODENOSCOPY  12/29/2011   Procedure: ESOPHAGOGASTRODUODENOSCOPY (EGD);  Surgeon: Petra Kuba, MD;  Location: [facility] ENDOSCOPY;  Service: Endoscopy;  Laterality: N/A;     OB History   No obstetric history on file.     Family History  Problem Relation Age of Onset  . Cancer Mother   . Stroke Father      Social History   Tobacco Use  . Smoking status: Never Smoker  . Smokeless tobacco: Never Used  Substance Use Topics  . Alcohol use: No  . Drug use: No    Home Medications Prior to Admission medications   Medication Sig Start Date End Date Taking? Authorizing Provider  acetaminophen (TYLENOL) 500 MG tablet Take 500 mg by mouth every 6 (six) hours as needed.    [provider]  amLODipine (NORVASC) 5 MG tablet Take 1 tablet (5 mg total) by mouth daily. 12/30/11   Vassie Loll, MD  Ascorbic Acid (VITA-C PO) Take 500 mg by mouth daily.    [provider]  b complex vitamins tablet Take 1 tablet by mouth daily.    [provider]  Calcium Carb-Cholecalciferol (CALCIUM 600+D) 600-800 MG-UNIT TABS Take by mouth daily.    [provider]  Cholecalciferol (VITAMIN D3) 1.25 MG (50000 UT) CAPS Take by mouth daily.    [provider]  diclofenac Sodium (VOLTAREN) 1 % GEL Apply 2 g topically 4 (four) times daily. 06/04/19   Jaimee Corum S, PA-C  ferrous sulfate 325 (65 FE) MG tablet Take 325 mg by mouth daily with breakfast.    [provider]  furosemide (LASIX) 40 MG tablet Take 1 tablet (40 mg total) by mouth daily. 05/13/19 08/11/19  Tobb, Lavona Mound, DO  HYDROcodone-acetaminophen (NORCO/VICODIN) 5-325 MG tablet Take 1 tablet by mouth every 8 (eight)  hours as needed. 06/04/19   Najah Liverman S, PA-C  lidocaine (LIDODERM) 5 % Place 1 patch onto the skin daily. Remove & Discard patch within 12 hours or as directed by MD 06/04/19   Delmas Faucett S, PA-C  Multiple Vitamins-Minerals (MULTIVITAMIN WITH MINERALS) tablet Take 1 tablet by mouth daily.    [provider]  Potassium 99 MG TABS Take by mouth daily.    [provider]    Allergies    Patient has no known allergies.  Review of Systems   Review of Systems  Constitutional: Negative for fever.  Respiratory: Negative for shortness of breath.   Cardiovascular:  Negative for chest pain.  Gastrointestinal: Negative for abdominal pain, constipation, diarrhea, nausea and vomiting.  Genitourinary: Negative for dysuria, flank pain, frequency, hematuria and urgency.  Musculoskeletal: Positive for back pain. Negative for gait problem.  Skin: Negative for wound.  Neurological: Negative for weakness and numbness.    Physical Exam Updated Vital Signs BP (!) 158/64 (BP Location: Left Arm)   Pulse 83   Temp 98.1 F (36.7 C) (Oral)   Resp 16   Ht 5\' 5"  (1.651 m)   Wt 82.6 kg   SpO2 99%   BMI 30.29 kg/m   Physical Exam Vitals and nursing note reviewed.  Constitutional:      General: She is not in acute distress.    Appearance: She is well-developed.  HENT:     Head: Normocephalic and atraumatic.  Eyes:     Conjunctiva/sclera: Conjunctivae normal.  Cardiovascular:     Rate and Rhythm: Normal rate and regular rhythm.     Heart sounds: Murmur present.  Pulmonary:     Effort: Pulmonary effort is normal. No respiratory distress.     Breath sounds: Normal breath sounds. No wheezing, rhonchi or rales.  Abdominal:     Palpations: Abdomen is soft.  Musculoskeletal:     Cervical back: Neck supple.     Comments: No midline lumbar TTP. TTP noted to the left sciatic notch that reproduces pain. 5/5 strength noted to the BLE with normal sensation throughout. DP pulses 2+ and symmetric. Ambulatory with steady gait with slight limp.   Skin:    General: Skin is warm and dry.  Neurological:     Mental Status: She is alert.     ED Results / Procedures / Treatments   Labs (all labs ordered are listed, but only abnormal results are displayed) Labs Reviewed - No data to display  EKG None  Radiology DG Lumbar Spine Complete  Result Date: 06/04/2019 CLINICAL DATA:  Left hip pain radiating down left leg for 1-2 days. EXAM: LUMBAR SPINE - COMPLETE 4+ VIEW COMPARISON:  None FINDINGS: There is a curvature of the lumbar spine which appears convex towards the  left. The vertebral body heights are well preserved. No fractures or dislocations. Marked disc space narrowing and endplate spurring noted at L2-3 and L3-4. Bilateral facet degenerative change identified at L4-5 and L5-S1. Calcified aortic atherosclerosis noted. IMPRESSION: 1. No acute findings. 2. Lumbar scoliosis and degenerative disc disease. Electronically Signed   By: Kerby Moors M.D.   On: 06/04/2019 10:24   DG Hip Unilat W or Wo Pelvis 2-3 Views Left  Result Date: 06/04/2019 CLINICAL DATA:  Left hip pain radiating down the left leg over the last few days. EXAM: DG HIP (WITH OR WITHOUT PELVIS) 2-3V LEFT COMPARISON:  None. FINDINGS: There is no evidence of hip fracture or dislocation. There is no evidence of arthropathy  or other focal bone abnormality. Lower lumbar degenerative changes are evident. IMPRESSION: Negative appearance of the hip. Ordinary lower lumbar degenerative changes as seen in a limited fashion. Electronically Signed   By: Paulina Fusi M.D.   On: 06/04/2019 10:16    Procedures Procedures (including critical care time)  Medications Ordered in ED Medications  lidocaine (LIDODERM) 5 % 1 patch (1 patch Transdermal Patch Applied 06/04/19 0824)  HYDROcodone-acetaminophen (NORCO/VICODIN) 5-325 MG per tablet 1 tablet (1 tablet Oral Given 06/04/19 0824)  fentaNYL (SUBLIMAZE) injection 50 mcg (50 mcg Intramuscular Given 06/04/19 1044)    ED Course  I have reviewed the triage vital signs and the nursing notes.  Pertinent labs & imaging results that were available during my care of the patient were reviewed by me and considered in my medical decision making (see chart for details).    MDM Rules/Calculators/A&P                      86 year old female presenting for evaluation of left lower back pain that started after lifting some heavy pots 2 days ago.  She denies any associated red flag signs or symptoms.  She does have some tenderness over the left sciatic notch on exam and  pain does radiate down the left lower extremity.  Her symptoms do seem consistent with sciatica.  Given her age will obtain x-rays of the lumbar spine and pelvis to rule out any occult fracture.  Reviewed/interpreted imaging studies X-ray lumbar spine negative for acute traumatic injury, scoliosis and degenerative changes noted X-ray pelvis left hip negative for acute traumatic injury  Patient was given pain medication and a Lidoderm patch.  On reassessment she feels improved after pain medications.  She is ambulatory in the ED with minimal assistance.  We discussed the plan for discharge with pain medication, Voltaren gel, Lidoderm patches.  Advised patient and her daughter at bedside on plan for close follow-up with PCP and strict return precautions.  They voiced understanding of plan and reasons to return.  All questions answered.  Patient stable for discharge.  Final Clinical Impression(s) / ED Diagnoses Final diagnoses:  Sciatica of left side    Rx / DC Orders ED Discharge Orders         Ordered    diclofenac Sodium (VOLTAREN) 1 % GEL  4 times daily     06/04/19 1100    lidocaine (LIDODERM) 5 %  Every 24 hours     06/04/19 1100    HYDROcodone-acetaminophen (NORCO/VICODIN) 5-325 MG tablet  Every 8 hours PRN     06/04/19 1100           Gale Klar S, PA-C 06/04/19 1103    Vanetta Mulders, MD 06/05/19 [phone]

## 2019-06-04 NOTE — Discharge Instructions (Signed)
You may take 625mg  of tylenol every 8 hours. You should also use the Voltaren gel and Lidoderm patches as directed.   If you have breakthrough pain you can take one tablet of the Norco every 8 hours. Take medication as directed and do not operate machinery, drive a car, or work while taking this medication as it can make you drowsy.   You may also alternate using ice or heat to help with your symptoms.   Please follow up with your primary care provider within 5-7 days for re-evaluation of your symptoms.  Return to the emergency department immediately if you experience any back pain associated with fevers, loss of control of your bowels/bladder, weakness/numbness to your legs, numbness to your groin area, inability to walk, or inability to urinate.

## 2019-06-04 NOTE — ED Notes (Signed)
Pt transported to xray 

## 2019-06-04 NOTE — ED Provider Notes (Signed)
Medical screening examination/treatment/procedure(s) were conducted as a shared visit with non-physician practitioner(s) and myself.  I personally evaluated the patient during the encounter.     Patient seen by me along with physician assistant.  Patient with complaint of some left buttocks pain that radiates down thigh little bit of the anterior part of the leg.  Patient did yard work on Tuesday woke up on Wednesday at about 3 in the morning with the pain.  Pain makes it difficult for her to weight-bear on that side but she is able to walk.  Denies any numbness or weakness to the left foot.  No problem with the right lower extremity or the right side of the back.  No previous similar symptoms.  There was no known fall or direct injury but she was lifting heavy pots.  Patient with subjective pain in the left buttocks area but no tenderness to palpation.  No tenderness over the left hip area.  Patient with good strength in her left foot.  Good cap refill.  Physician assistant will get x-rays of the back and left hip area.  Clinically feel that this is more a vague lumbar back problem with some atypical symptoms of sciatica.  Disposition will be based on x-rays.     Vanetta Mulders, MD 06/04/19 [phone]

## 2019-06-05 ENCOUNTER — Other Ambulatory Visit: Payer: Medicare HMO

## 2019-06-25 ENCOUNTER — Ambulatory Visit: Payer: Medicare HMO | Admitting: Cardiology

## 2019-08-25 ENCOUNTER — Ambulatory Visit (HOSPITAL_COMMUNITY)
Admission: RE | Admit: 2019-08-25 | Discharge: 2019-08-25 | Disposition: A | Discharge status: Home / Self Care | Payer: Medicare HMO | Source: Ambulatory Visit | Attending: Family | Admitting: Family

## 2019-08-25 ENCOUNTER — Other Ambulatory Visit (HOSPITAL_COMMUNITY): Payer: Self-pay | Admitting: Family

## 2019-08-25 ENCOUNTER — Other Ambulatory Visit: Payer: Self-pay | Admitting: Family

## 2019-08-25 ENCOUNTER — Encounter (HOSPITAL_COMMUNITY): Payer: Self-pay

## 2019-08-25 DIAGNOSIS — G8929 Other chronic pain: Secondary | ICD-10-CM | POA: Insufficient documentation

## 2019-08-25 DIAGNOSIS — R519 Headache, unspecified: Secondary | ICD-10-CM | POA: Diagnosis present

## 2020-04-21 ENCOUNTER — Ambulatory Visit: Payer: Medicare HMO | Admitting: Podiatry

## 2021-03-17 IMAGING — DX DG FOOT 2V*R*
2 series · 2 of 2 positions shown · non-contrast
Comparison: None.

CLINICAL DATA: Right foot edema, no pain

EXAM:
RIGHT FOOT - 2 VIEW

[x foot ap right]
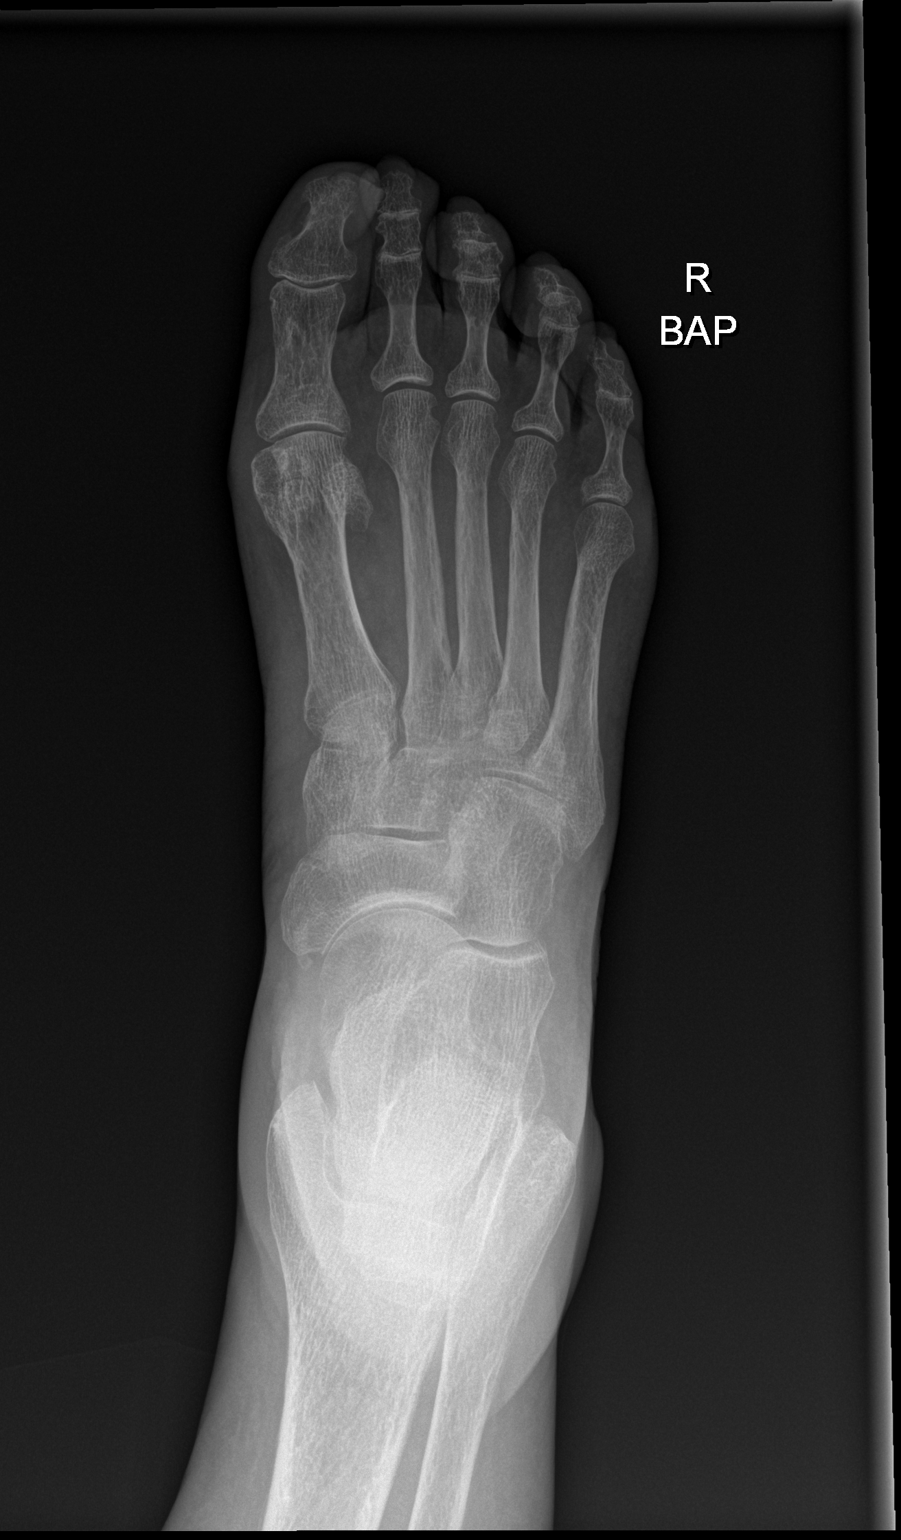

[x foot lat right]
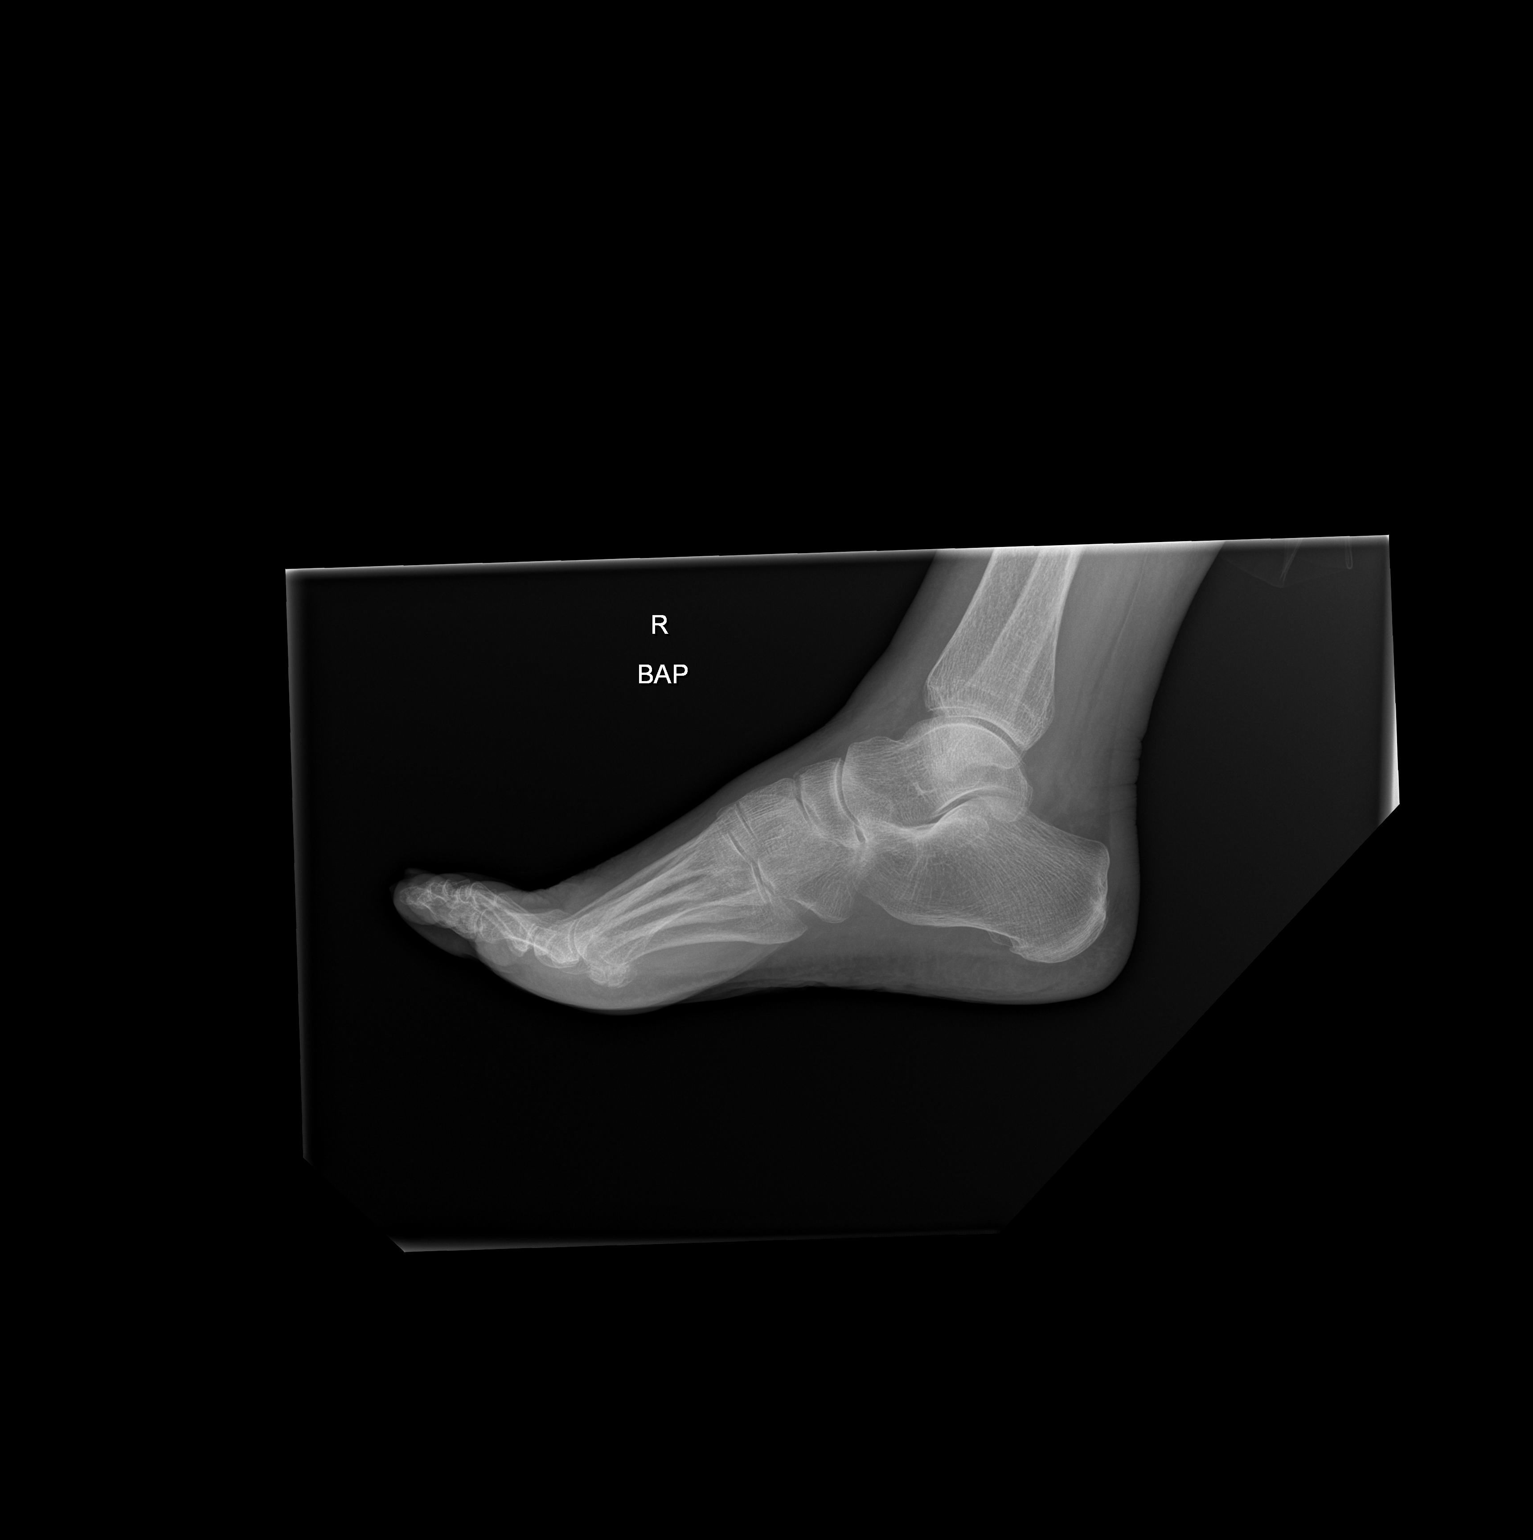

[2 of 2 positions shown; findings below may reference images not displayed]

FINDINGS: There is no evidence of fracture or dislocation. There is no
evidence of arthropathy or other focal bone abnormality. Soft
tissues are unremarkable.
IMPRESSION: No fracture or dislocation of the right foot. No radiographic
abnormality.

## 2021-04-30 IMAGING — CR DG HIP (WITH OR WITHOUT PELVIS) 2-3V*L*
3 series · 3 of 3 positions shown · non-contrast
Comparison: None.

CLINICAL DATA: Left hip pain radiating down the left leg over the
last few days.

EXAM:
DG HIP (WITH OR WITHOUT PELVIS) 2-3V LEFT

[pelvis ap]
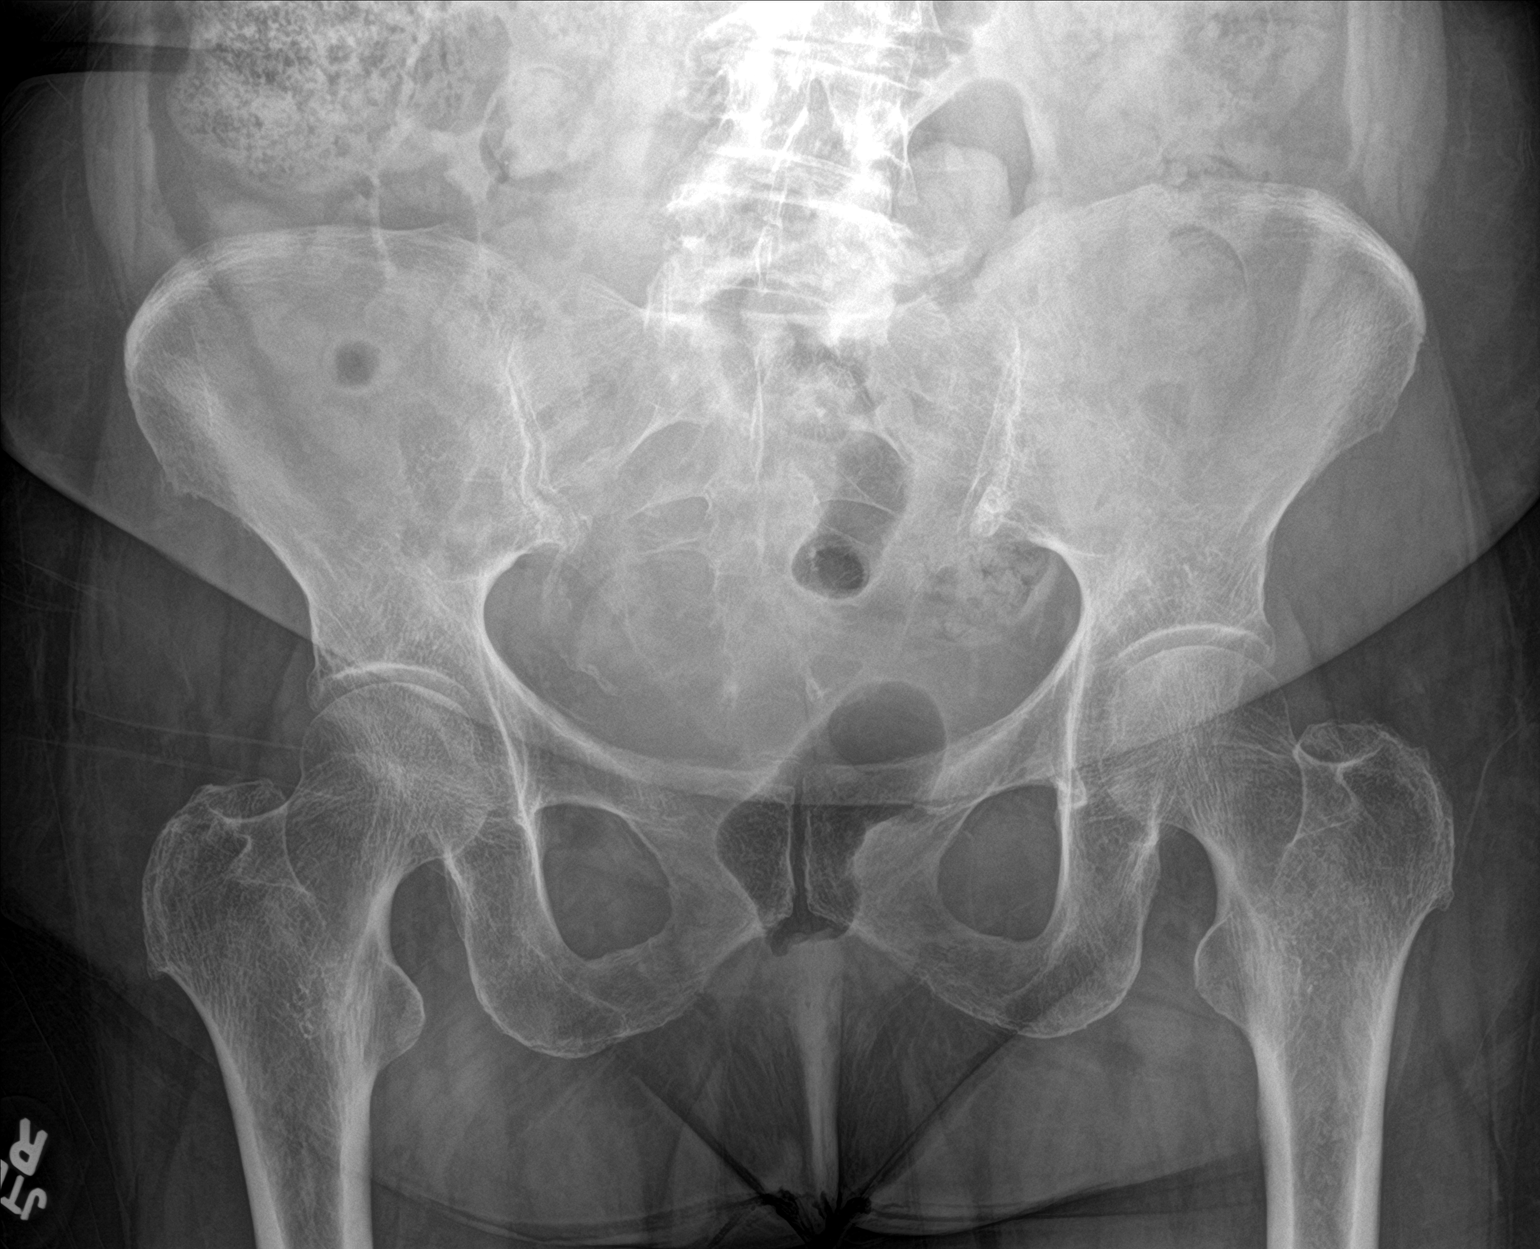

[hip ap]
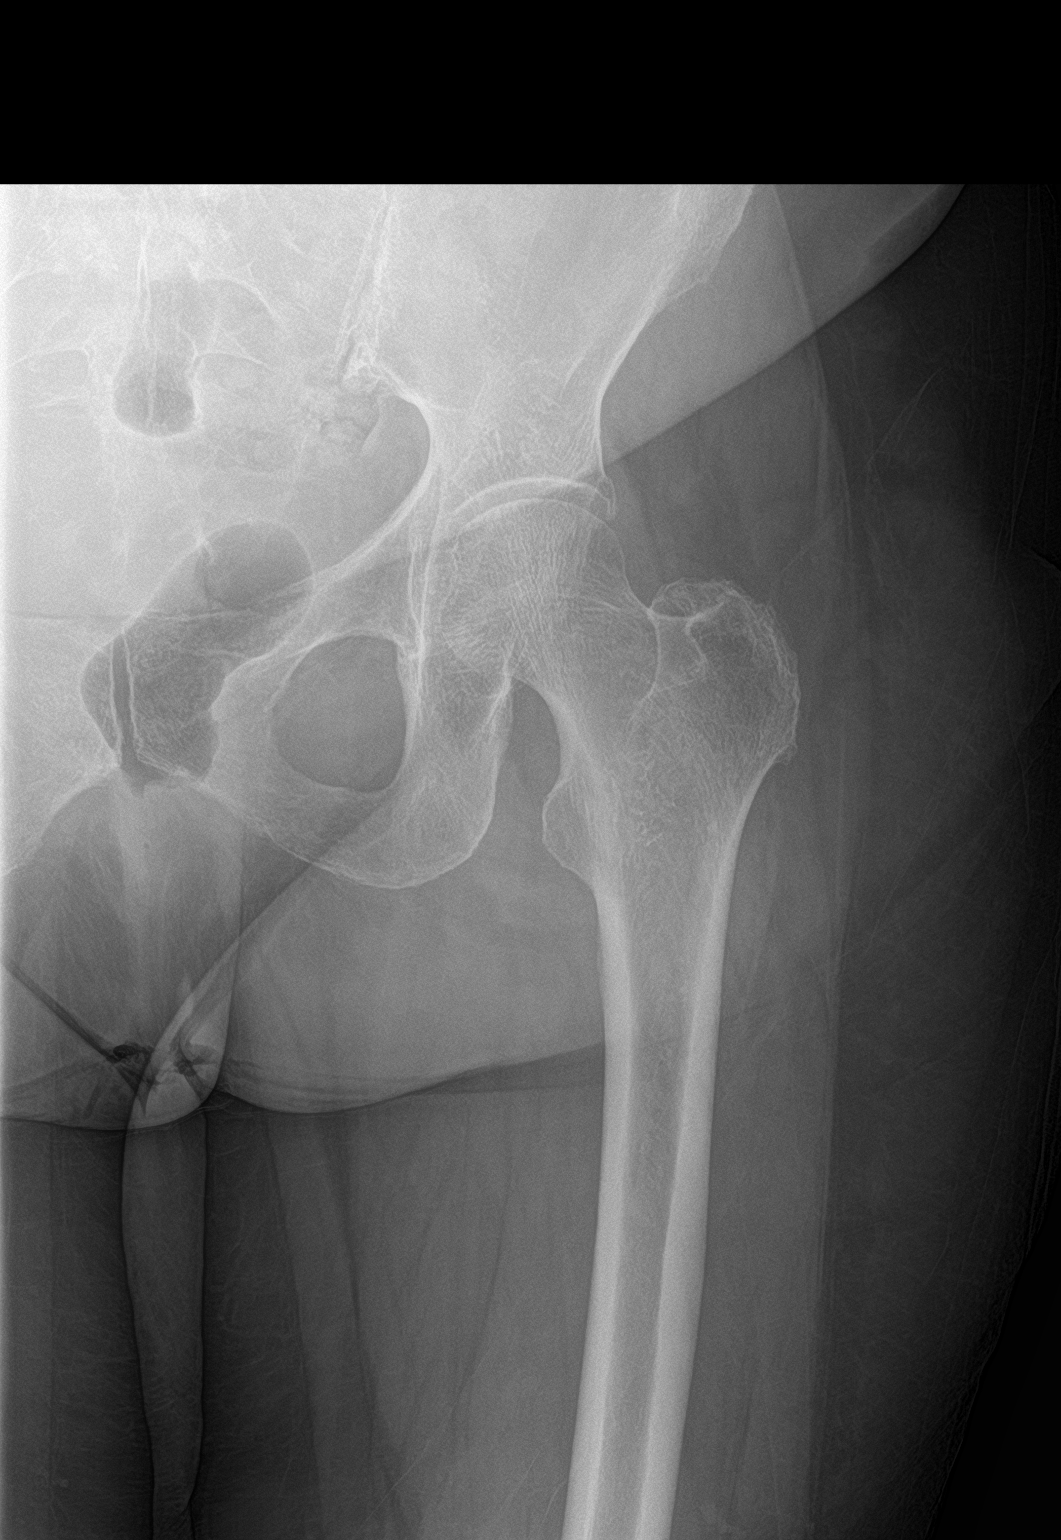

[hip lat]
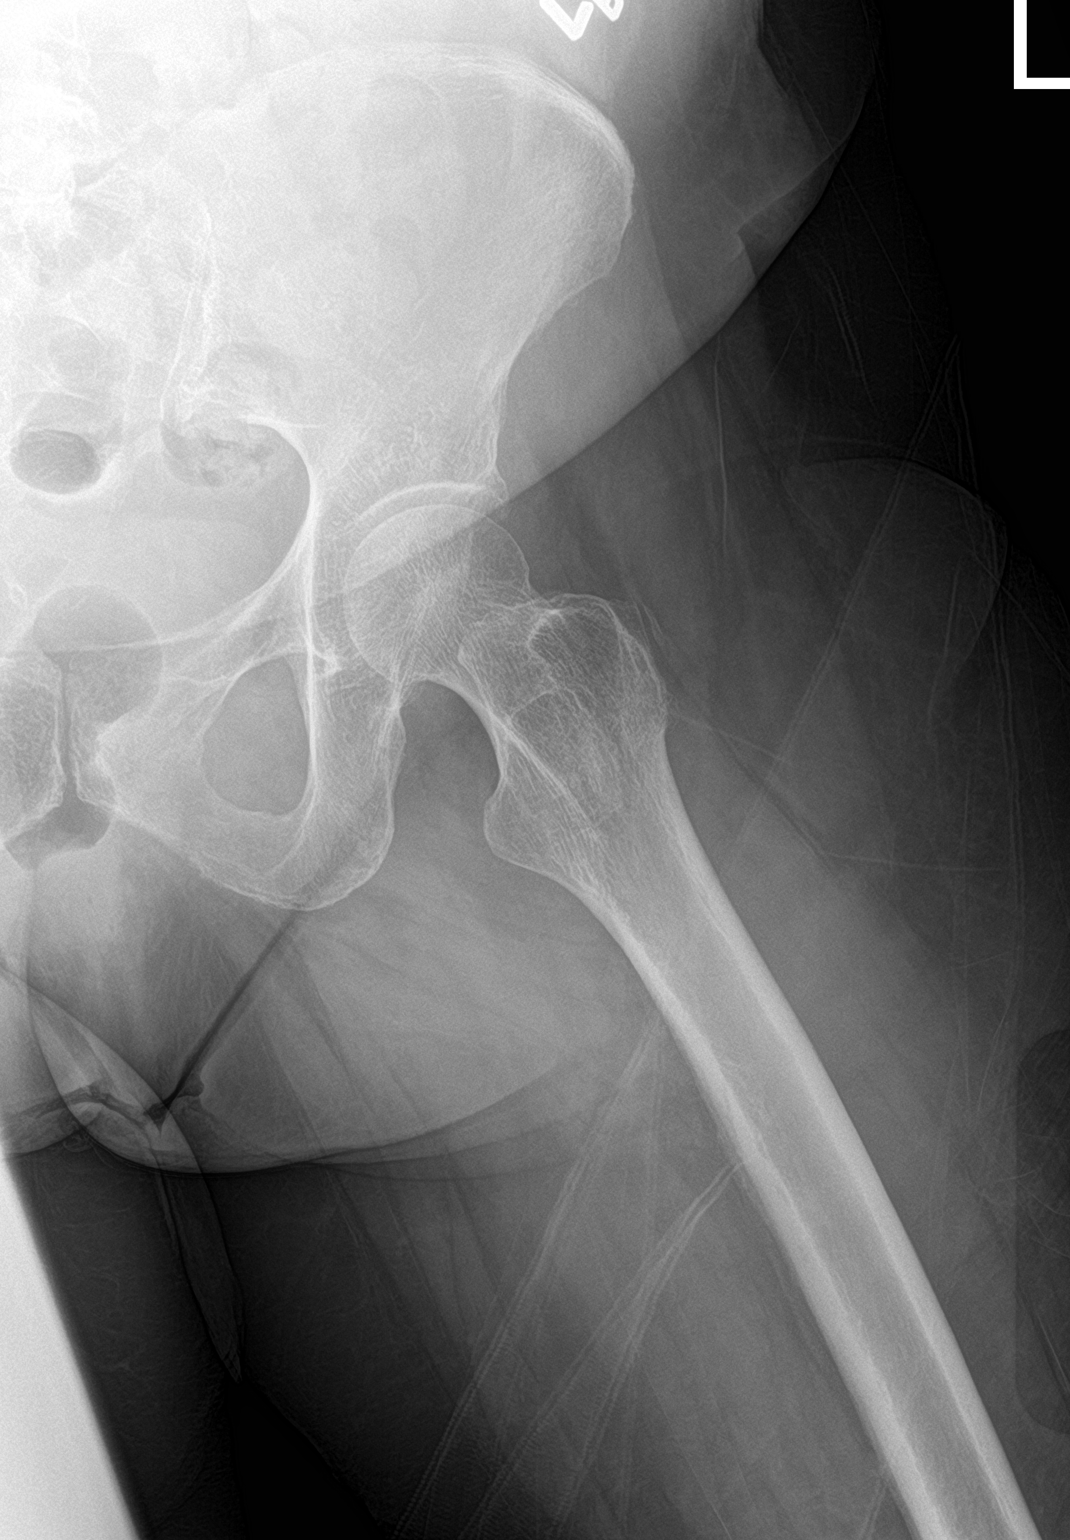

[3 of 3 positions shown; findings below may reference images not displayed]

FINDINGS: There is no evidence of hip fracture or dislocation. There is no
evidence of arthropathy or other focal bone abnormality. Lower
lumbar degenerative changes are evident.
IMPRESSION: Negative appearance of the hip. Ordinary lower lumbar degenerative
changes as seen in a limited fashion.

## 2022-08-22 ENCOUNTER — Other Ambulatory Visit: Payer: Self-pay

## 2022-08-22 ENCOUNTER — Emergency Department (HOSPITAL_COMMUNITY)
Admission: EM | Admit: 2022-08-22 | Discharge: 2022-08-22 | Disposition: A | Discharge status: Home / Self Care | Payer: Medicare HMO | Attending: Emergency Medicine | Admitting: Emergency Medicine

## 2022-08-22 ENCOUNTER — Encounter (HOSPITAL_COMMUNITY): Payer: Self-pay

## 2022-08-22 ENCOUNTER — Emergency Department (HOSPITAL_COMMUNITY): Payer: Medicare HMO

## 2022-08-22 DIAGNOSIS — Z7901 Long term (current) use of anticoagulants: Secondary | ICD-10-CM | POA: Insufficient documentation

## 2022-08-22 DIAGNOSIS — Z79899 Other long term (current) drug therapy: Secondary | ICD-10-CM | POA: Diagnosis not present

## 2022-08-22 DIAGNOSIS — I4891 Unspecified atrial fibrillation: Secondary | ICD-10-CM | POA: Insufficient documentation

## 2022-08-22 DIAGNOSIS — R42 Dizziness and giddiness: Secondary | ICD-10-CM | POA: Diagnosis present

## 2022-08-22 DIAGNOSIS — I10 Essential (primary) hypertension: Secondary | ICD-10-CM | POA: Insufficient documentation

## 2022-08-22 LAB — CBC
HCT: 43.2 % (ref 36.0–46.0)
Hemoglobin: 14.1 g/dL (ref 12.0–15.0)
MCH: 29.8 pg (ref 26.0–34.0)
MCHC: 32.6 g/dL (ref 30.0–36.0)
MCV: 91.3 fL (ref 80.0–100.0)
Platelets: 202 10*3/uL (ref 150–400)
RBC: 4.73 MIL/uL (ref 3.87–5.11)
RDW: 13.2 % (ref 11.5–15.5)
WBC: 10.9 10*3/uL — ABNORMAL HIGH (ref 4.0–10.5)
nRBC: 0 % (ref 0.0–0.2)

## 2022-08-22 LAB — TROPONIN I (HIGH SENSITIVITY)
Troponin I (High Sensitivity): 18 ng/L — ABNORMAL HIGH (ref <18)
Troponin I (High Sensitivity): 20 ng/L — ABNORMAL HIGH (ref <18)

## 2022-08-22 LAB — BASIC METABOLIC PANEL
Anion gap: 13 (ref 5–15)
BUN: 19 mg/dL (ref 8–23)
CO2: 26 mmol/L (ref 22–32)
Calcium: 9.5 mg/dL (ref 8.9–10.3)
Chloride: 103 mmol/L (ref 98–111)
Creatinine, Ser: 1.08 mg/dL — ABNORMAL HIGH (ref 0.44–1.00)
GFR, Estimated: 50 mL/min — ABNORMAL LOW (ref >60)
Glucose, Bld: 122 mg/dL — ABNORMAL HIGH (ref 70–99)
Potassium: 4.1 mmol/L (ref 3.5–5.1)
Sodium: 142 mmol/L (ref 135–145)

## 2022-08-22 LAB — MAGNESIUM: Magnesium: 2.1 mg/dL (ref 1.7–2.4)

## 2022-08-22 LAB — TSH: TSH: 2.035 u[IU]/mL (ref 0.350–4.500)

## 2022-08-22 MED ORDER — APIXABAN (ELIQUIS) EDUCATION KIT FOR DVT/PE PATIENTS
PACK | Freq: Once | Status: AC
  Filled 2022-08-22 (×2): qty 1

## 2022-08-22 MED ORDER — APIXABAN 5 MG PO TABS
5.0000 mg | ORAL_TABLET | Freq: Once | ORAL | Status: AC
  Administered 2022-08-22: 5 mg via ORAL
  Filled 2022-08-22: qty 1

## 2022-08-22 MED ORDER — METOPROLOL SUCCINATE ER 25 MG PO TB24: 25.0000 mg | ORAL_TABLET | Freq: Every day | ORAL | 0 refills | Status: DC

## 2022-08-22 MED ORDER — APIXABAN 5 MG PO TABS: 5.0000 mg | ORAL_TABLET | Freq: Two times a day (BID) | ORAL | 0 refills | Status: DC

## 2022-08-22 MED ORDER — APIXABAN 2.5 MG PO TABS
2.5000 mg | ORAL_TABLET | Freq: Once | ORAL | Status: DC
Start: 2022-08-22 — End: 2022-08-22

## 2022-08-22 NOTE — Discharge Instructions (Addendum)
You will need to stop the Amlodipine and start Metoprolol.  The Metoprolol will help your blood pressure and help control your heart rate (which can elevated quickly if you have atrial fib)  Information on my medicine - ELIQUIS (apixaban)  This medication education was reviewed with me or my healthcare representative as part of my discharge preparation.  Why was Eliquis prescribed for you? Eliquis was prescribed for you to reduce the risk of a blood clot forming that can cause a stroke if you have a medical condition called atrial fibrillation (a type of irregular heartbeat).  What do You need to know about Eliquis ? Take your Eliquis TWICE DAILY - one tablet in the morning and one tablet in the evening with or without food. If you have difficulty swallowing the tablet whole please discuss with your pharmacist how to take the medication safely.  Take Eliquis exactly as prescribed by your doctor and DO NOT stop taking Eliquis without talking to the doctor who prescribed the medication.  Stopping may increase your risk of developing a stroke.  Refill your prescription before you run out.  After discharge, you should have regular check-up appointments with your healthcare provider that is prescribing your Eliquis.  In the future your dose may need to be changed if your kidney function or weight changes by a significant amount or as you get older.  What do you do if you miss a dose? If you miss a dose, take it as soon as you remember on the same day and resume taking twice daily.  Do not take more than one dose of ELIQUIS at the same time to make up a missed dose.  Important Safety Information A possible side effect of Eliquis is bleeding. You should call your healthcare provider right away if you experience any of the following: Bleeding from an injury or your nose that does not stop. Unusual colored urine (red or dark brown) or unusual colored stools (red or black). Unusual bruising for  unknown reasons. A serious fall or if you hit your head (even if there is no bleeding).  Some medicines may interact with Eliquis and might increase your risk of bleeding or clotting while on Eliquis. To help avoid this, consult your healthcare provider or pharmacist prior to using any new prescription or non-prescription medications, including herbals, vitamins, non-steroidal anti-inflammatory drugs (NSAIDs) and supplements.  This website has more information on Eliquis (apixaban): http://www.eliquis.com/eliquis/home

## 2022-08-22 NOTE — ED Triage Notes (Signed)
Coming from PCP office alert and oriented dizzy this AM hx of anemia so went to pcp,  the pcp office found afib rate controlled for PCP ems and here but sent her here bc she was dizzy

## 2022-08-22 NOTE — ED Provider Notes (Signed)
[place] EMERGENCY DEPARTMENT AT [facility] Provider Note   CSN: [id number] Arrival date & time: 08/22/22  1807     History  No chief complaint on file.   [name] [name] is a 89 y.o. female.  Pt is a 89 yo female with pmhx significant for anemia, gerd, and htn.  Pt said she has been feeling a little weak, so she went to her PCP.  While there, she was found to have new onset afib.  EMS was called to take her here.  She denies cp, sob, no speech or vision changes.  No one sided weakness.  Pt does not feel the irregular hr.       Home Medications Prior to Admission medications   Medication Sig Start Date End Date Taking? Authorizing Provider  apixaban (ELIQUIS) 5 MG TABS tablet Take 1 tablet (5 mg total) by mouth 2 (two) times daily. 08/22/22 09/21/22 Yes Jacalyn Lefevre, MD  metoprolol succinate (TOPROL-XL) 25 MG 24 hr tablet Take 1 tablet (25 mg total) by mouth daily. 08/22/22  Yes Jacalyn Lefevre, MD  acetaminophen (TYLENOL) 500 MG tablet Take 500 mg by mouth every 6 (six) hours as needed.    [provider]  Ascorbic Acid (VITA-C PO) Take 500 mg by mouth daily.    [provider]  b complex vitamins tablet Take 1 tablet by mouth daily.    [provider]  Calcium Carb-Cholecalciferol (CALCIUM 600+D) 600-800 MG-UNIT TABS Take by mouth daily.    [provider]  Cholecalciferol (VITAMIN D3) 1.25 MG (50000 UT) CAPS Take by mouth daily.    [provider]  diclofenac Sodium (VOLTAREN) 1 % GEL Apply 2 g topically 4 (four) times daily. 06/04/19   Couture, Cortni S, PA-C  ferrous sulfate 325 (65 FE) MG tablet Take 325 mg by mouth daily with breakfast.    [provider]  furosemide (LASIX) 40 MG tablet Take 1 tablet (40 mg total) by mouth daily. 05/13/19 08/11/19  Tobb, Lavona Mound, DO  HYDROcodone-acetaminophen (NORCO/VICODIN) 5-325 MG tablet Take 1 tablet by mouth every 8 (eight) hours as needed. 06/04/19   Couture, Cortni S, PA-C   lidocaine (LIDODERM) 5 % Place 1 patch onto the skin daily. Remove & Discard patch within 12 hours or as directed by MD 06/04/19   Couture, Cortni S, PA-C  Multiple Vitamins-Minerals (MULTIVITAMIN WITH MINERALS) tablet Take 1 tablet by mouth daily.    [provider]  Potassium 99 MG TABS Take by mouth daily.    [provider]      Allergies    Patient has no known allergies.    Review of Systems   Review of Systems  Neurological:  Positive for weakness.  All other systems reviewed and are negative.   Physical Exam Updated Vital Signs BP 128/88   Pulse 74   Temp (!) 97.4 F (36.3 C) (Oral)   Resp 15   Ht 5\' 5"  (1.651 m)   Wt 82.6 kg   SpO2 100%   BMI 30.30 kg/m  Physical Exam Vitals and nursing note reviewed.  Constitutional:      Appearance: Normal appearance.  HENT:     Head: Normocephalic and atraumatic.     Right Ear: External ear normal.     Left Ear: External ear normal.     Nose: Nose normal.     Mouth/Throat:     Mouth: Mucous membranes are moist.     Pharynx: Oropharynx is clear.  Eyes:  Extraocular Movements: Extraocular movements intact.     Conjunctiva/sclera: Conjunctivae normal.     Pupils: Pupils are equal, round, and reactive to light.  Cardiovascular:     Rate and Rhythm: Normal rate. Rhythm irregular.     Pulses: Normal pulses.     Heart sounds: Normal heart sounds.  Pulmonary:     Effort: Pulmonary effort is normal.     Breath sounds: Normal breath sounds.  Abdominal:     General: Abdomen is flat. Bowel sounds are normal.     Palpations: Abdomen is soft.  Musculoskeletal:        General: Normal range of motion.     Cervical back: Normal range of motion and neck supple.  Skin:    General: Skin is warm.     Capillary Refill: Capillary refill takes less than 2 seconds.  Neurological:     General: No focal deficit present.     Mental Status: She is alert and oriented to person, place, and time.  Psychiatric:         Mood and Affect: Mood normal.        Behavior: Behavior normal.     ED Results / Procedures / Treatments   Labs (all labs ordered are listed, but only abnormal results are displayed) Labs Reviewed  BASIC METABOLIC PANEL - Abnormal; Notable for the following components:      Result Value   Glucose, Bld 122 (*)    Creatinine, Ser 1.08 (*)    GFR, Estimated 50 (*)    All other components within normal limits  CBC - Abnormal; Notable for the following components:   WBC 10.9 (*)    All other components within normal limits  TROPONIN I (HIGH SENSITIVITY) - Abnormal; Notable for the following components:   Troponin I (High Sensitivity) 18 (*)    All other components within normal limits  MAGNESIUM  TSH  TROPONIN I (HIGH SENSITIVITY)    EKG EKG Interpretation Date/Time:  Wednesday August 22 2022 18:22:02 EDT Ventricular Rate:  133 PR Interval:    QRS Duration:  125 QT Interval:  332 QTC Calculation: 494 R Axis:   106  Text Interpretation: Atrial flutter with predominant 2:1 AV block RBBB and LPFB new since prior Confirmed by Jacalyn Lefevre [phone]) on 08/22/2022 7:09:42 PM  Radiology DG Chest Port 1 View  Result Date: 08/22/2022 CLINICAL DATA:  Atrial fibrillation EXAM: PORTABLE CHEST 1 VIEW COMPARISON:  None Available. FINDINGS: Borderline cardiopericardial silhouette. Calcified tortuous aorta. Elevated left hemidiaphragm. Mild left lung base opacity. No pneumothorax or edema. Overlapping cardiac leads. IMPRESSION: Elevated left hemidiaphragm with some left lung base mild opacity. Etiology of the opacities uncertain. Please correlate with clinical presentation and if needed additional workup or follow-up. Enlarged heart. Electronically Signed   By: Karen Kays M.D.   On: 08/22/2022 18:35    Procedures Procedures    Medications Ordered in ED Medications  apixaban [facility]) Education Kit for DVT/PE patients (has no administration in time range)  apixaban (ELIQUIS) tablet 5 mg  (has no administration in time range)    ED Course/ Medical Decision Making/ A&P                                 Medical Decision Making Amount and/or Complexity of Data Reviewed Labs: ordered. Radiology: ordered.  Risk Prescription drug management.   This patient presents to the ED for concern of palpitations, this involves an  extensive number of treatment options, and is a complaint that carries with it a high risk of complications and morbidity.  The differential diagnosis includes afib/flutter, electrolyte abn, anemia   Co morbidities that complicate the patient evaluation  anemia, gerd, and htn   Additional history obtained:  Additional history obtained from epic chart review External records from outside source obtained and reviewed including EMS report   Lab Tests:  I Ordered, and personally interpreted labs.  The pertinent results include:  cbc nl, trop 18, bmp nl other than cr sl elevated at 1.08, mg 2.1   Imaging Studies ordered:  I ordered imaging studies including cxr  I independently visualized and interpreted imaging which showed  Elevated left hemidiaphragm with some left lung base mild opacity.  Etiology of the opacities uncertain. Please correlate with clinical  presentation and if needed additional workup or follow-up.    Enlarged heart.   I agree with the radiologist interpretation   Cardiac Monitoring:  The patient was maintained on a cardiac monitor.  I personally viewed and interpreted the cardiac monitored which showed an underlying rhythm of: afib   Medicines ordered and prescription drug management:  I ordered medication including eliquis  for afib  Reevaluation of the patient after these medicines showed that the patient improved I have reviewed the patients home medicines and have made adjustments as needed  Consultations Obtained:  I requested consultation with the cardiologist,  and discussed lab and imaging findings as well as  pertinent plan - they recommend: stating metoprolol   Problem List / ED Course:  Afib:  new onset.  Unclear how long it's been going on and she's not on anticoagulation.  So, she's not a candidate for cardioversion in the ED. Chadvasc score is 4, so Eliquis will be started.  I spoke with cards and they recommend stopping amlodipine and starting metoprolol.  Pt will need to f/u in the afib clinic.   Reevaluation:  After the interventions noted above, I reevaluated the patient and found that they have :improved   Social Determinants of Health:  Lives at home   Dispostion:  After consideration of the diagnostic results and the patients response to treatment, I feel that the patent would benefit from discharge with outpatient f/u.          Final Clinical Impression(s) / ED Diagnoses Final diagnoses:  Atrial fibrillation, unspecified type [facility])    Rx / DC Orders ED Discharge Orders          Ordered    Amb referral to AFIB Clinic        08/22/22 1816    metoprolol succinate (TOPROL-XL) 25 MG 24 hr tablet  Daily        08/22/22 2030    apixaban (ELIQUIS) 5 MG TABS tablet  2 times daily        08/22/22 2030              Jacalyn Lefevre, MD 08/22/22 2032

## 2022-08-22 NOTE — ED Notes (Signed)
Assumed care of pt sent by PCP for new onset AFIb and dizziness. Pt a/o x 4 respirations even and non labored vs wnl Family supportive at bedside

## 2022-08-30 ENCOUNTER — Ambulatory Visit (HOSPITAL_COMMUNITY): Payer: Medicare HMO | Admitting: Internal Medicine

## 2022-09-03 ENCOUNTER — Ambulatory Visit (HOSPITAL_COMMUNITY)
Admission: RE | Admit: 2022-09-03 | Discharge: 2022-09-03 | Disposition: A | Discharge status: Home / Self Care | Payer: Medicare HMO | Source: Ambulatory Visit | Attending: Internal Medicine | Admitting: Internal Medicine

## 2022-09-03 ENCOUNTER — Encounter (HOSPITAL_COMMUNITY): Payer: Self-pay | Admitting: Internal Medicine

## 2022-09-03 VITALS — BP 130/82 | HR 53 | Ht 65.0 in | Wt 178.0 lb

## 2022-09-03 DIAGNOSIS — K219 Gastro-esophageal reflux disease without esophagitis: Secondary | ICD-10-CM | POA: Insufficient documentation

## 2022-09-03 DIAGNOSIS — I452 Bifascicular block: Secondary | ICD-10-CM | POA: Insufficient documentation

## 2022-09-03 DIAGNOSIS — I4891 Unspecified atrial fibrillation: Secondary | ICD-10-CM | POA: Diagnosis not present

## 2022-09-03 DIAGNOSIS — I480 Paroxysmal atrial fibrillation: Secondary | ICD-10-CM | POA: Insufficient documentation

## 2022-09-03 DIAGNOSIS — D6869 Other thrombophilia: Secondary | ICD-10-CM | POA: Diagnosis not present

## 2022-09-03 DIAGNOSIS — I10 Essential (primary) hypertension: Secondary | ICD-10-CM | POA: Diagnosis not present

## 2022-09-03 MED ORDER — METOPROLOL SUCCINATE ER 25 MG PO TB24: 25.0000 mg | ORAL_TABLET | Freq: Every day | ORAL | 1 refills | Status: DC

## 2022-09-03 MED ORDER — APIXABAN 5 MG PO TABS: 5.0000 mg | ORAL_TABLET | Freq: Two times a day (BID) | ORAL | 1 refills | Status: AC

## 2022-09-03 NOTE — Progress Notes (Signed)
Primary Care Physician: Erskine Emery, NP Primary Cardiologist: Thomasene Ripple, DO Electrophysiologist: None     Referring Physician: [facility]     [name] [name] is a 89 y.o. female with a history of anemia, GERD, HTN, and paroxysmal atrial fibrillation who presents for consultation in the [facility].  Seen by PCP for weakness and found to be in Afib and sent to ED on 08/22/22. She does not appear to have cardiac awareness. Amlodipine stopped and metoprolol (Toprol 25 mg) started. Patient is on Eliquis for a CHADS2VASC score of 4.  On evaluation today, she is currently in NSR. She has not felt similar symptoms since ED visit. She notes the last time she felt that weak was when her Hemoglobin was abnormally low. She has been compliant with metoprolol and Eliquis. She currently notes a little bit of bilateral ankle swelling and states right is worse than left.   Today, she denies symptoms of palpitations, chest pain, shortness of breath, orthopnea, PND, lower extremity edema, dizziness, presyncope, syncope, snoring, daytime somnolence, bleeding, or neurologic sequela. The patient is tolerating medications without difficulties and is otherwise without complaint today.    she has a BMI of Body mass index is 29.62 kg/m.Marland Kitchen Filed Weights   09/03/22 1354  Weight: 80.7 kg    Current Outpatient Medications  Medication Sig Dispense Refill   acetaminophen (TYLENOL) 500 MG tablet Take 500 mg by mouth every 6 (six) hours as needed.     Ascorbic Acid (VITA-C PO) Take 500 mg by mouth daily.     Calcium Carb-Cholecalciferol (CALCIUM 600+D) 600-800 MG-UNIT TABS Take by mouth daily.     Cholecalciferol (VITAMIN D3) 1.25 MG (50000 UT) CAPS Take by mouth daily.     Cyanocobalamin (VITAMIN B-12 PO) Take by mouth daily.     diclofenac Sodium (VOLTAREN) 1 % GEL Apply 2 g topically 4 (four) times daily. (Patient taking differently: Apply 2 g topically as needed.) 100 g 0    famotidine (PEPCID) 40 MG tablet Take 40 mg by mouth as needed for heartburn or indigestion.     furosemide (LASIX) 40 MG tablet Take 1 tablet (40 mg total) by mouth daily. (Patient taking differently: Take 20 mg by mouth daily.) 30 tablet 12   Potassium 99 MG TABS Take by mouth daily.     apixaban (ELIQUIS) 5 MG TABS tablet Take 1 tablet (5 mg total) by mouth 2 (two) times daily. 180 tablet 1   lidocaine (LIDODERM) 5 % Place 1 patch onto the skin daily. Remove & Discard patch within 12 hours or as directed by MD (Patient not taking: Reported on 09/03/2022) 30 patch 0   metoprolol succinate (TOPROL-XL) 25 MG 24 hr tablet Take 1 tablet (25 mg total) by mouth daily. 90 tablet 1   No current facility-administered medications for this encounter.    Atrial Fibrillation Management history:  Previous antiarrhythmic drugs: None Previous cardioversions: None Previous ablations: None Anticoagulation history: Eliquis  ROS- All systems are reviewed and negative except as per the HPI above.  Physical Exam: BP 130/82   Pulse (!) 53   Ht 5\' 5"  (1.651 m)   Wt 80.7 kg   BMI 29.62 kg/m   GEN: Well nourished, well developed in no acute distress NECK: No JVD; No carotid bruits CARDIAC: Regular bradycardic rate and rhythm, no murmurs, rubs, gallops RESPIRATORY:  Clear to auscultation without rales, wheezing or rhonchi  ABDOMEN: Soft, non-tender, non-distended EXTREMITIES:  No edema;  No deformity   EKG today demonstrates  Vent. rate 53 BPM PR interval 158 ms QRS duration 124 ms QT/QTcB 508/476 ms P-R-T axes 50 124 78 Sinus bradycardia Right bundle branch block Possible Lateral infarct , age undetermined Cannot rule out Inferior infarct , age undetermined Abnormal ECG When compared with ECG of 22-Aug-2022 18:22, PREVIOUS ECG IS PRESENT  Echo 12/28/11 demonstrated  Study Conclusions   - Left ventricle: The cavity size was normal. Wall thickness    was normal. Systolic function was normal.  The estimated    ejection fraction was in the range of 55% to 60%. Wall    motion was normal; there were no regional wall motion    abnormalities.  - Mitral valve: Mild regurgitation.  - Left atrium: The atrium was moderately dilated.  - Atrial septum: No defect or patent foramen ovale was    identified.  - Impressions: First parasternal image suggests circular    calcified echolucency posterior to heart. Suggest Chest CT    to further evaluate   ASSESSMENT & PLAN CHA2DS2-VASc Score = 4  The patient's score is based upon: CHF History: 0 HTN History: 1 Diabetes History: 0 Stroke History: 0 Vascular Disease History: 0 Age Score: 2 Gender Score: 1       ASSESSMENT AND PLAN: Paroxysmal Atrial Fibrillation (ICD10:  I48.0) The patient's CHA2DS2-VASc score is 4, indicating a 4.8% annual risk of stroke.    Education provided about Afib. Discussion about medication treatments and ablation going forward if indicated. After discussion, we will proceed with conservative observation at this time. Rhythm monitoring device recommended.   Will arrange for echo next visit.    Secondary Hypercoagulable State (ICD10:  D68.69) The patient is at significant risk for stroke/thromboembolism based upon her CHA2DS2-VASc Score of 4.  Continue Apixaban (Eliquis).  No missed doses.      Follow up 1 month Afib clinic to assess burden.   Lake Bells, PA-C  Afib Clinic [facility] [address] [place], [place] [address] [phone]

## 2022-10-03 ENCOUNTER — Ambulatory Visit (HOSPITAL_COMMUNITY)
Admission: RE | Admit: 2022-10-03 | Discharge: 2022-10-03 | Disposition: A | Discharge status: Home / Self Care | Payer: Medicare HMO | Source: Ambulatory Visit | Attending: Internal Medicine | Admitting: Internal Medicine

## 2022-10-03 VITALS — BP 156/70 | HR 58 | Ht 65.0 in | Wt 174.6 lb

## 2022-10-03 DIAGNOSIS — D6869 Other thrombophilia: Secondary | ICD-10-CM | POA: Insufficient documentation

## 2022-10-03 DIAGNOSIS — Z7901 Long term (current) use of anticoagulants: Secondary | ICD-10-CM | POA: Diagnosis not present

## 2022-10-03 DIAGNOSIS — I10 Essential (primary) hypertension: Secondary | ICD-10-CM | POA: Insufficient documentation

## 2022-10-03 DIAGNOSIS — K219 Gastro-esophageal reflux disease without esophagitis: Secondary | ICD-10-CM | POA: Insufficient documentation

## 2022-10-03 DIAGNOSIS — I480 Paroxysmal atrial fibrillation: Secondary | ICD-10-CM | POA: Insufficient documentation

## 2022-10-03 LAB — CBC
HCT: 40.2 % (ref 36.0–46.0)
Hemoglobin: 12.8 g/dL (ref 12.0–15.0)
MCH: 28.8 pg (ref 26.0–34.0)
MCHC: 31.8 g/dL (ref 30.0–36.0)
MCV: 90.5 fL (ref 80.0–100.0)
Platelets: 196 10*3/uL (ref 150–400)
RBC: 4.44 MIL/uL (ref 3.87–5.11)
RDW: 13.1 % (ref 11.5–15.5)
WBC: 9 10*3/uL (ref 4.0–10.5)
nRBC: 0 % (ref 0.0–0.2)

## 2022-10-03 NOTE — Progress Notes (Signed)
Primary Care Physician: Erskine Emery, NP Primary Cardiologist: Thomasene Ripple, DO Electrophysiologist: None     Referring Physician: [facility]     [name] [name] is a 89 y.o. female with a history of anemia, GERD, HTN, and paroxysmal atrial fibrillation who presents for consultation in the [facility].  Seen by PCP for weakness and found to be in Afib and sent to ED on 08/22/22. She does not appear to have cardiac awareness. Amlodipine stopped and metoprolol (Toprol 25 mg) started. Patient is on Eliquis for a CHADS2VASC score of 4.  On evaluation today, she is currently in NSR. She has not felt similar symptoms since ED visit. She notes the last time she felt that weak was when her Hemoglobin was abnormally low. She has been compliant with metoprolol and Eliquis. She currently notes a little bit of bilateral ankle swelling and states right is worse than left.   On follow up 10/03/22, she is currently in NSR. She has had no episodes of Afib since last OV. She states she feels like her normal self again. No missed doses of Eliquis.   Today, she denies symptoms of palpitations, chest pain, shortness of breath, orthopnea, PND, lower extremity edema, dizziness, presyncope, syncope, snoring, daytime somnolence, bleeding, or neurologic sequela. The patient is tolerating medications without difficulties and is otherwise without complaint today.    she has a BMI of Body mass index is 29.05 kg/m.Marland Kitchen Filed Weights   10/03/22 1411  Weight: 79.2 kg     Current Outpatient Medications  Medication Sig Dispense Refill   acetaminophen (TYLENOL) 500 MG tablet Take 500 mg by mouth as needed.     apixaban (ELIQUIS) 5 MG TABS tablet Take 1 tablet (5 mg total) by mouth 2 (two) times daily. 180 tablet 1   Ascorbic Acid (VITA-C PO) Take 500 mg by mouth daily.     Calcium Carb-Cholecalciferol (CALCIUM 600+D) 600-800 MG-UNIT TABS Take 1 tablet by mouth daily.     Cyanocobalamin  (VITAMIN B-12 PO) Take 1 tablet by mouth daily.     diclofenac Sodium (VOLTAREN) 1 % GEL Apply 2 g topically 4 (four) times daily. (Patient taking differently: Apply 2 g topically as needed.) 100 g 0   famotidine (PEPCID) 40 MG tablet Take 40 mg by mouth as needed for heartburn or indigestion.     furosemide (LASIX) 40 MG tablet Take 1 tablet (40 mg total) by mouth daily. 30 tablet 12   Homeopathic Products (ALLERGY MEDICINE PO) Take 1 tablet by mouth daily. Not sure what the medication is called     metoprolol succinate (TOPROL-XL) 25 MG 24 hr tablet Take 1 tablet (25 mg total) by mouth daily. 90 tablet 1   Potassium 99 MG TABS Take 1 tablet by mouth daily.     No current facility-administered medications for this encounter.    Atrial Fibrillation Management history:  Previous antiarrhythmic drugs: None Previous cardioversions: None Previous ablations: None Anticoagulation history: Eliquis  ROS- All systems are reviewed and negative except as per the HPI above.  Physical Exam: BP (!) 156/70   Pulse (!) 58   Ht 5\' 5"  (1.651 m)   Wt 79.2 kg   BMI 29.05 kg/m   GEN- The patient is well appearing, alert and oriented x 3 today.   Neck - no JVD or carotid bruit noted Lungs- Clear to ausculation bilaterally, normal work of breathing Heart- Regular rate and rhythm, no murmurs, rubs or gallops, PMI  not laterally displaced Extremities- no clubbing, cyanosis, or edema Skin - no rash or ecchymosis noted   EKG today demonstrates  Vent. rate 58 BPM PR interval 176 ms QRS duration 126 ms QT/QTcB 484/475 ms P-R-T axes 13 -67 6 Sinus bradycardia Right bundle branch block Left anterior fascicular block  Bifascicular block  Abnormal ECG When compared with ECG of 03-Sep-2022 14:08, PREVIOUS ECG IS PRESENT  Echo 12/28/11 demonstrated  Study Conclusions   - Left ventricle: The cavity size was normal. Wall thickness    was normal. Systolic function was normal. The estimated    ejection  fraction was in the range of 55% to 60%. Wall    motion was normal; there were no regional wall motion    abnormalities.  - Mitral valve: Mild regurgitation.  - Left atrium: The atrium was moderately dilated.  - Atrial septum: No defect or patent foramen ovale was    identified.  - Impressions: First parasternal image suggests circular    calcified echolucency posterior to heart. Suggest Chest CT    to further evaluate   ASSESSMENT & PLAN CHA2DS2-VASc Score = 4  The patient's score is based upon: CHF History: 0 HTN History: 1 Diabetes History: 0 Stroke History: 0 Vascular Disease History: 0 Age Score: 2 Gender Score: 1      ASSESSMENT AND PLAN: Paroxysmal Atrial Fibrillation (ICD10:  I48.0) The patient's CHA2DS2-VASc score is 4, indicating a 4.8% annual risk of stroke.    She is currently in NSR.  After discussion, she declines repeat echo at this time. She will f/u as needed with Afib clinic per her request. She does agree that if in future has recurrent episodes of Afib then will be happy to repeat echo. She will definitely contact us if she notes increasing Afib burden in the future. I am okay with this.    Secondary Hypercoagulable State (ICD10:  D68.69) The patient is at significant risk for stroke/thromboembolism based upon her CHA2DS2-VASc Score of 4.  Continue Apixaban (Eliquis).  No missed doses. CBC drawn today.     Follow up PRN Afib clinic.   Lake Bells, PA-C  Afib Clinic [facility] [address] [place], [place] [address] [phone]

## 2023-02-11 ENCOUNTER — Emergency Department (HOSPITAL_COMMUNITY)
Admission: EM | Admit: 2023-02-11 | Discharge: 2023-02-11 | Disposition: A | Discharge status: Home / Self Care | Payer: Medicare HMO | Attending: Emergency Medicine | Admitting: Emergency Medicine

## 2023-02-11 ENCOUNTER — Encounter (HOSPITAL_COMMUNITY): Payer: Self-pay

## 2023-02-11 DIAGNOSIS — H538 Other visual disturbances: Secondary | ICD-10-CM | POA: Insufficient documentation

## 2023-02-11 DIAGNOSIS — Z7901 Long term (current) use of anticoagulants: Secondary | ICD-10-CM | POA: Diagnosis not present

## 2023-02-11 MED ORDER — TETRACAINE HCL 0.5 % OP SOLN
2.0000 [drp] | Freq: Once | OPHTHALMIC | Status: AC
  Administered 2023-02-11: 2 [drp] via OPHTHALMIC
  Filled 2023-02-11: qty 4

## 2023-02-11 MED ORDER — FLUORESCEIN SODIUM 1 MG OP STRP
1.0000 | ORAL_STRIP | Freq: Once | OPHTHALMIC | Status: AC
  Administered 2023-02-11: 1 via OPHTHALMIC
  Filled 2023-02-11: qty 1

## 2023-02-11 NOTE — ED Provider Notes (Signed)
[facility] AT [facility] Provider Note   CSN: [id number] Arrival date & time: 02/11/23  2000     History  Chief Complaint  Patient presents with   Eye Pain    [name] [name] is a 89 y.o. female with history of A-fib on Eliquis presenting to the ED with blurred vision in her right eye.  Patient reports that she was rubbing her eye last night woke up this morning and the eye seem blurry.  She denies to me that she is having pain on the eye.  She went to an urgent care and they told her to come to the ED for further evaluation.  She reports she has had "tear duct surgery" on her eyes but no other surgical procedures.  She is nearsighted and wears glasses at baseline.  She sees an ophthalmologist in [place] where she lives.  She is here with family at bedside  HPI     Home Medications Prior to Admission medications   Medication Sig Start Date End Date Taking? Authorizing Provider  apixaban (ELIQUIS) 5 MG TABS tablet Take 1 tablet (5 mg total) by mouth 2 (two) times daily. 09/03/22  Yes Eustace Pen, PA-C  CALCIUM PO Take 1 tablet by mouth daily with breakfast.   Yes [provider]  Cholecalciferol (VITAMIN D3) 125 MCG (5000 UT) CAPS Take 5,000 Units by mouth daily.   Yes [provider]  Cyanocobalamin (VITAMIN B-12 PO) Take 1 tablet by mouth daily.   Yes [provider]  famotidine (PEPCID) 40 MG tablet Take 40 mg by mouth daily as needed for heartburn or indigestion.   Yes [provider]  furosemide (LASIX) 40 MG tablet Take 1 tablet (40 mg total) by mouth daily. Patient taking differently: Take 20 mg by mouth daily as needed for fluid or edema. 05/13/19 02/11/23 Yes Tobb, Kardie, DO  metoprolol succinate (TOPROL-XL) 25 MG 24 hr tablet Take 1 tablet (25 mg total) by mouth daily. 09/03/22  Yes Eustace Pen, PA-C  Potassium 99 MG TABS Take 99 mg by mouth daily.   Yes [provider]  TYLENOL 8 HOUR  650 MG CR tablet Take 650 mg by mouth every 8 (eight) hours as needed for pain.   Yes [provider]  diclofenac Sodium (VOLTAREN) 1 % GEL Apply 2 g topically 4 (four) times daily. Patient not taking: Reported on 02/11/2023 06/04/19   Couture, Cortni S, PA-C      Allergies    Patient has no known allergies.    Review of Systems   Review of Systems  Physical Exam Updated Vital Signs BP (!) 153/74   Pulse 70   Temp 98.1 F (36.7 C) (Oral)   Resp 18   SpO2 98%  Physical Exam Constitutional:      General: She is not in acute distress. HENT:     Head: Normocephalic and atraumatic.  Eyes:     Conjunctiva/sclera: Conjunctivae normal.     Pupils: Pupils are equal, round, and reactive to light.     Comments: Vision 20/30 bilaterally, EOM intact, no hyphema noted, conjunctiva normal, small splotching lesion noted overlying right lower iris (not obstructing pupil) without foreign body visualized on exam; normal fluorescence staining with no abnormal seidel test  Cardiovascular:     Rate and Rhythm: Normal rate and regular rhythm.  Pulmonary:     Effort: Pulmonary effort is normal. No respiratory distress.  Skin:    General: Skin  is warm and dry.  Neurological:     General: No focal deficit present.     Mental Status: She is alert. Mental status is at baseline.     ED Results / Procedures / Treatments   Labs (all labs ordered are listed, but only abnormal results are displayed) Labs Reviewed - No data to display  EKG None  Radiology No results found.  Procedures Procedures    Medications Ordered in ED Medications  fluorescein ophthalmic strip 1 strip (1 strip Both Eyes Given 02/11/23 2106)  tetracaine (PONTOCAINE) 0.5 % ophthalmic solution 2 drop (2 drops Both Eyes Given 02/11/23 2106)    ED Course/ Medical Decision Making/ A&P Clinical Course as of 02/11/23 2351  Mon Feb 11, 2023  2246 I spoke to Dr Fabian Sharp - okay to follow up in office tomorrow.  Pt and  family comfortable with this. [MT]    Clinical Course User Index [MT] Kerri Kovacik, Kermit Balo, MD                                 Medical Decision Making Risk Prescription drug management.   Pt here with blurred vision right eye, onset this morning  No evidence of retinal detachment, angle closure glaucoma, hyphema, globe rupture, or hypopion.    Small lesion anterior eye may be capillary bleed from rubbing her eye last night; she's on eliquis, but this doesn't correlate with complete vision blurriness.  However her visual acuity also appears to be fairly normal and equal in both eyes as well.  I spoke to Dr Dione Booze from ophtho by phone - I agree this patient is stable to be seen tomorrow, and not presenting with a likely emergent ocular process - pt and family are comfortable with this.        Final Clinical Impression(s) / ED Diagnoses Final diagnoses:  Blurred vision, right eye    Rx / DC Orders ED Discharge Orders     None         Darryn Kydd, Kermit Balo, MD 02/11/23 2354

## 2023-02-11 NOTE — ED Triage Notes (Signed)
Pt states she had pain in it since lastnight, her daughter is who noticed the issue this afternoon, the iris of the eye looks ruptured upon exam in the right eye. She was seen by the UC and they sent her here. He eye was dyed and stained earlier in the UC.

## 2023-05-26 ENCOUNTER — Other Ambulatory Visit (HOSPITAL_COMMUNITY): Payer: Self-pay | Admitting: Internal Medicine

## 2023-11-28 ENCOUNTER — Other Ambulatory Visit (HOSPITAL_COMMUNITY): Payer: Self-pay | Admitting: Internal Medicine
